# Patient Record
Sex: Male | Born: 1980 | Race: White | Hispanic: No | Marital: Single | Smoking: Never smoker
Health system: Southern US, Community
[De-identification: ages and names within clinical notes are randomized; demographics above are authoritative.]

---

## 2010-11-10 ENCOUNTER — Ambulatory Visit: Payer: Self-pay | Admitting: Otolaryngology

## 2010-12-01 ENCOUNTER — Ambulatory Visit: Payer: Self-pay | Admitting: Otolaryngology

## 2011-04-03 ENCOUNTER — Emergency Department: Payer: Self-pay | Admitting: Internal Medicine

## 2012-09-18 IMAGING — CT CT NECK WITH CONTRAST
2 series · 10 of 14 positions shown, 12 images · IV contrast (isovue)
Comparison: none

REASON FOR EXAM: left lateral neck mass
COMMENTS:

PROCEDURE:     ZIKHALE - ZIKHALE NECK WITH CONTRAST  - November 10, 2010 [DATE]
RESULT:     Comparison: None.
TECHNIQUE: Standard soft tissue neck CT protocol, after administration of 75
mL Isovue 370.

[Series 2: soft tissue · axial · 0.55mm/px · z∈[-295,-64]mm · 8 of 101 slices shown, 10 images]
[im 12/101  soft-tissue]
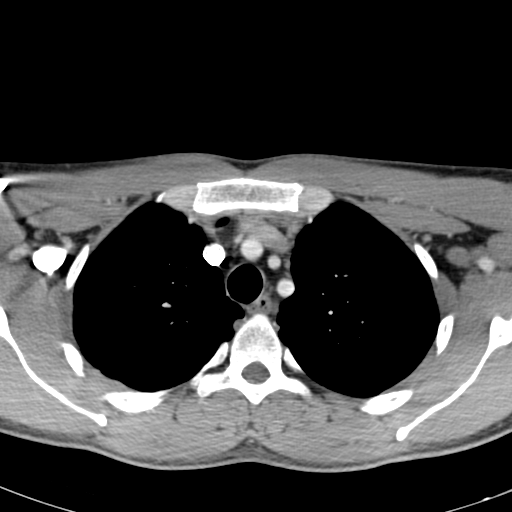
[im 12/101  bone]
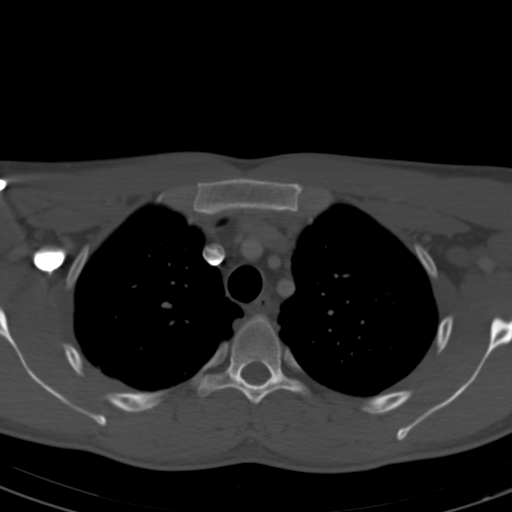
[im 23/101  bone]
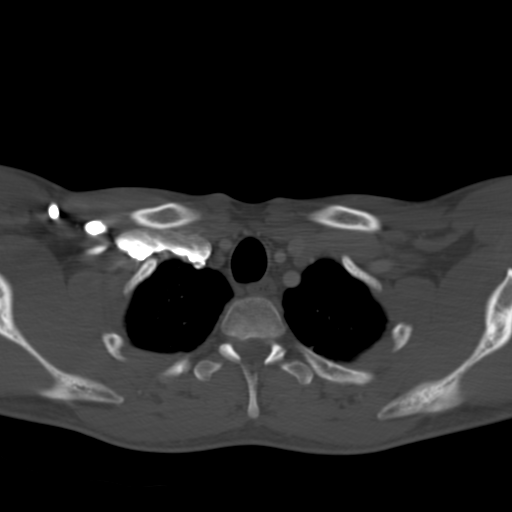
[im 34/101  bone]
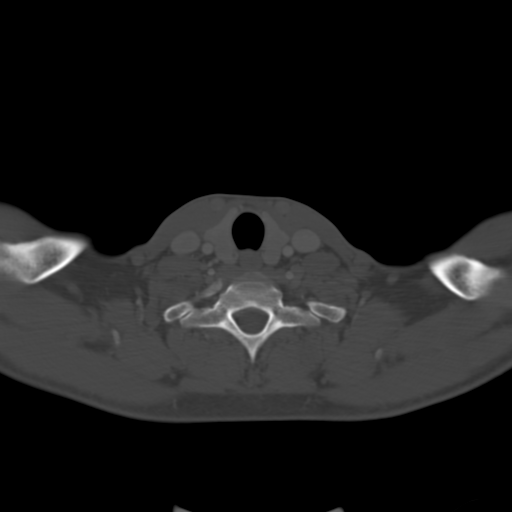
[im 45/101  bone]
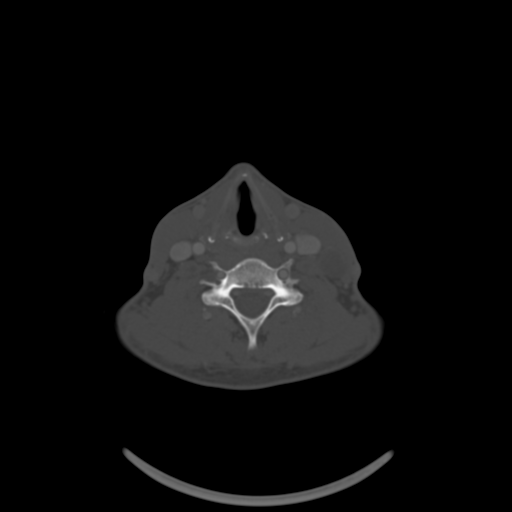
[im 56/101  soft-tissue]
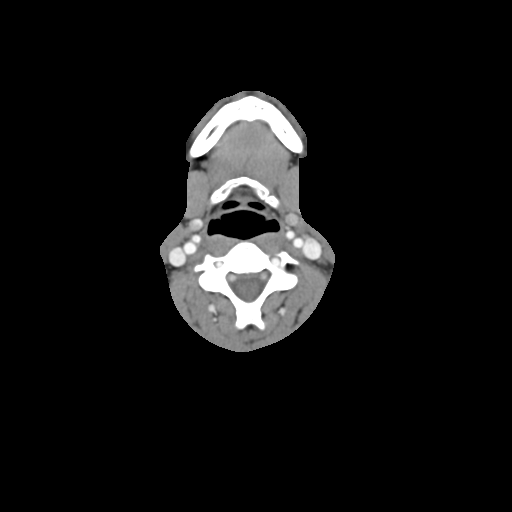
[im 56/101  bone]
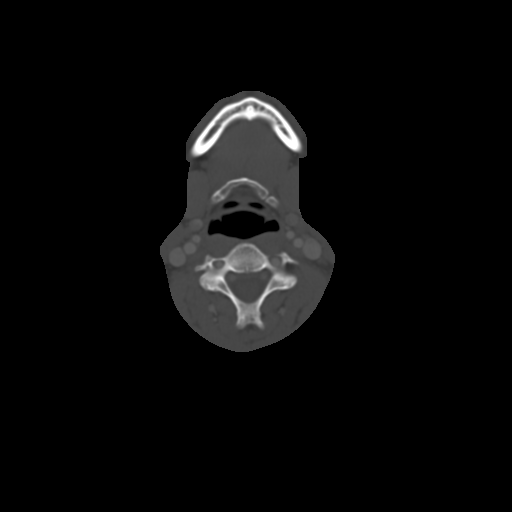
[im 67/101  bone]
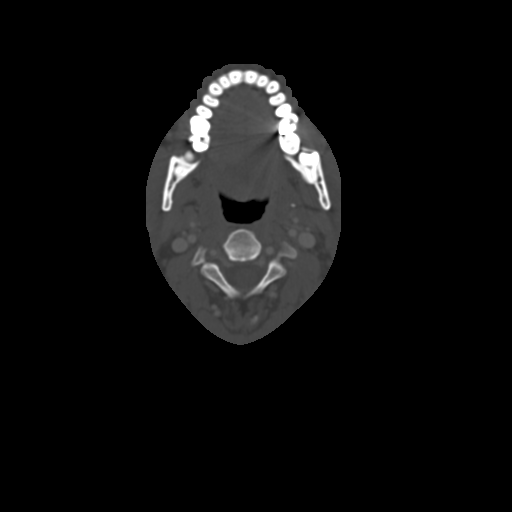
[im 78/101  bone]
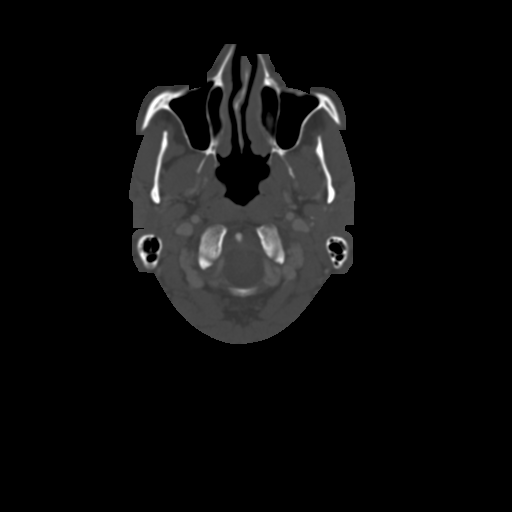
[im 89/101  bone]
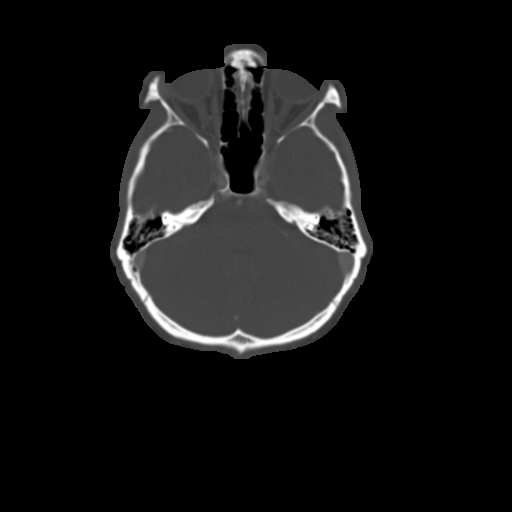

[Series 3: lung · axial · 0.55mm/px · z∈[-295,-262]mm · 2 of 34 slices shown]
[im 12/34  bone]
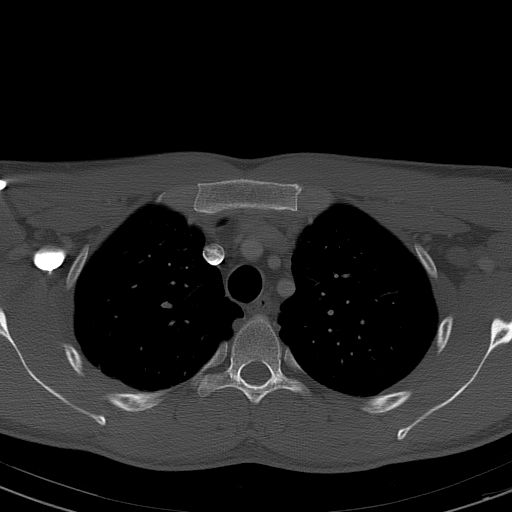
[im 23/34  bone]
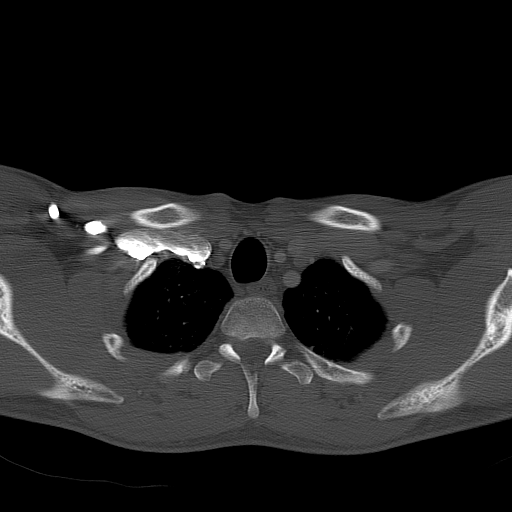

[10 of 14 positions shown; findings below may reference images not displayed]

FINDINGS: A palpable marker was placed along the left side of the neck at the reported
site of palpable abnormality. At the level of this BB, there is a round
low-attenuation mass. The density of which is greater than simple fluid. The
area of low density measures 1.7 x 1.6 cm in greatest axial dimension. There
appears to be a rim of enhancing tissue surrounding this lesion. This could
represent a rim of a lymph node. This mass is nonspecific. It is deep to the
sternocleidomastoid muscle and posterior lateral to the left jugular vein.
There are a few separate mildly prominent left carotid chain lymph nodes,
none of which are pathologically enlarged.

The airway is patent. A no aggressive lytic or sclerotic osseous lesions
identified.
IMPRESSION: Nonspecific cystic mass in the left side of the neck. There appears to be a
peripheral area of enhancing soft tissue. At a minimum, clinical followup is
recommended to ensure resolution, as the differential for this mass would
include a necrotic lymph node from squamous cell carcinoma. Tissue sampling
could be performed, as indicated.

## 2020-03-18 ENCOUNTER — Other Ambulatory Visit: Payer: Self-pay

## 2020-03-18 DIAGNOSIS — Z20822 Contact with and (suspected) exposure to covid-19: Secondary | ICD-10-CM

## 2020-03-20 LAB — NOVEL CORONAVIRUS, NAA: SARS-CoV-2, NAA: DETECTED — AB

## 2020-03-20 LAB — SARS-COV-2, NAA 2 DAY TAT

## 2020-11-30 ENCOUNTER — Emergency Department
Admission: EM | Admit: 2020-11-30 | Discharge: 2020-12-01 | Payer: Worker's Compensation | Attending: Emergency Medicine | Admitting: Emergency Medicine

## 2020-11-30 ENCOUNTER — Encounter: Payer: Self-pay | Admitting: *Deleted

## 2020-11-30 ENCOUNTER — Other Ambulatory Visit: Payer: Self-pay

## 2020-11-30 ENCOUNTER — Emergency Department: Payer: Worker's Compensation

## 2020-11-30 DIAGNOSIS — S82832A Other fracture of upper and lower end of left fibula, initial encounter for closed fracture: Secondary | ICD-10-CM | POA: Insufficient documentation

## 2020-11-30 DIAGNOSIS — S82302A Unspecified fracture of lower end of left tibia, initial encounter for closed fracture: Secondary | ICD-10-CM | POA: Insufficient documentation

## 2020-11-30 DIAGNOSIS — W19XXXA Unspecified fall, initial encounter: Secondary | ICD-10-CM

## 2020-11-30 DIAGNOSIS — Y990 Civilian activity done for income or pay: Secondary | ICD-10-CM | POA: Insufficient documentation

## 2020-11-30 DIAGNOSIS — Z20822 Contact with and (suspected) exposure to covid-19: Secondary | ICD-10-CM | POA: Insufficient documentation

## 2020-11-30 DIAGNOSIS — S82899A Other fracture of unspecified lower leg, initial encounter for closed fracture: Secondary | ICD-10-CM

## 2020-11-30 DIAGNOSIS — M79605 Pain in left leg: Secondary | ICD-10-CM | POA: Insufficient documentation

## 2020-11-30 DIAGNOSIS — W08XXXA Fall from other furniture, initial encounter: Secondary | ICD-10-CM | POA: Insufficient documentation

## 2020-11-30 LAB — CBC WITH DIFFERENTIAL/PLATELET
Abs Immature Granulocytes: 0.05 10*3/uL (ref 0.00–0.07)
Basophils Absolute: 0.1 10*3/uL (ref 0.0–0.1)
Basophils Relative: 1 %
Eosinophils Absolute: 0.1 10*3/uL (ref 0.0–0.5)
Eosinophils Relative: 1 %
HCT: 43.1 % (ref 39.0–52.0)
Hemoglobin: 15.2 g/dL (ref 13.0–17.0)
Immature Granulocytes: 0 %
Lymphocytes Relative: 13 %
Lymphs Abs: 1.7 10*3/uL (ref 0.7–4.0)
MCH: 32.1 pg (ref 26.0–34.0)
MCHC: 35.3 g/dL (ref 30.0–36.0)
MCV: 90.9 fL (ref 80.0–100.0)
Monocytes Absolute: 0.6 10*3/uL (ref 0.1–1.0)
Monocytes Relative: 5 %
Neutro Abs: 10.4 10*3/uL — ABNORMAL HIGH (ref 1.7–7.7)
Neutrophils Relative %: 80 %
Platelets: 234 10*3/uL (ref 150–400)
RBC: 4.74 MIL/uL (ref 4.22–5.81)
RDW: 12.8 % (ref 11.5–15.5)
WBC: 13 10*3/uL — ABNORMAL HIGH (ref 4.0–10.5)
nRBC: 0 % (ref 0.0–0.2)

## 2020-11-30 LAB — COMPREHENSIVE METABOLIC PANEL
ALT: 19 U/L (ref 0–44)
AST: 20 U/L (ref 15–41)
Albumin: 4.5 g/dL (ref 3.5–5.0)
Alkaline Phosphatase: 45 U/L (ref 38–126)
Anion gap: 9 (ref 5–15)
BUN: 11 mg/dL (ref 6–20)
CO2: 27 mmol/L (ref 22–32)
Calcium: 9.1 mg/dL (ref 8.9–10.3)
Chloride: 99 mmol/L (ref 98–111)
Creatinine, Ser: 0.77 mg/dL (ref 0.61–1.24)
GFR, Estimated: >60 mL/min (ref >60)
Glucose, Bld: 105 mg/dL — ABNORMAL HIGH (ref 70–99)
Potassium: 4.4 mmol/L (ref 3.5–5.1)
Sodium: 135 mmol/L (ref 135–145)
Total Bilirubin: 1 mg/dL (ref 0.3–1.2)
Total Protein: 7.8 g/dL (ref 6.5–8.1)

## 2020-11-30 LAB — RESP PANEL BY RT-PCR (FLU A&B, COVID) ARPGX2
Influenza A by PCR: NEGATIVE
Influenza B by PCR: NEGATIVE
SARS Coronavirus 2 by RT PCR: NEGATIVE

## 2020-11-30 MED ORDER — ONDANSETRON HCL 4 MG/2ML IJ SOLN
4.0000 mg | Freq: Once | INTRAMUSCULAR | Status: AC
  Administered 2020-11-30: 4 mg via INTRAVENOUS
  Filled 2020-11-30: qty 2

## 2020-11-30 MED ORDER — FENTANYL CITRATE (PF) 250 MCG/5ML IJ SOLN
100.0000 ug | Freq: Once | INTRAMUSCULAR | Status: AC
Start: 2020-11-30 — End: 2020-11-30
  Administered 2020-11-30: 100 ug via INTRAMUSCULAR
  Filled 2020-11-30: qty 5

## 2020-11-30 MED ORDER — FENTANYL CITRATE PF 50 MCG/ML IJ SOSY
50.0000 ug | PREFILLED_SYRINGE | Freq: Once | INTRAMUSCULAR | Status: AC
  Administered 2020-11-30: 50 ug via INTRAVENOUS
  Filled 2020-11-30: qty 1

## 2020-11-30 NOTE — ED Notes (Signed)
Shera to [employer] to [employer]

## 2020-11-30 NOTE — ED Provider Notes (Signed)
[facility]-EMERGENCY DEPARTMENT  ____________________________________________  Time seen: Approximately 9:09 PM  I have reviewed the triage vital signs and the nursing notes.   HISTORY  Chief Complaint Leg Injury   Historian Patient     HPI Kenneth Park. is a 40 y.o. male presents to the emergency department after patient had a mechanical fall from a 2 foot step stool.  Patient states that he inverted his ankle and lost his balance, falling backwards.  Patient denies hitting his head or his neck.  He denies chest pain, chest tightness or abdominal pain.  No numbness or tingling in the upper and lower extremities.  Patient has not been able to bear weight since injury occurred.  He has no skin compromise of the left lower extremity.   No past medical history on file.   Immunizations up to date:  Yes.     No past medical history on file.  There are no problems to display for this patient.     Prior to Admission medications   Not on File    Allergies Patient has no known allergies.  No family history on file.  Social History Social History   Tobacco Use   Smoking status: Never   Smokeless tobacco: Never  Substance Use Topics   Alcohol use: Yes     Review of Systems  Constitutional: No fever/chills Eyes:  No discharge ENT: No upper respiratory complaints. Respiratory: no cough. No SOB/ use of accessory muscles to breath Gastrointestinal:   No nausea, no vomiting.  No diarrhea.  No constipation. Musculoskeletal: Patient has left lower leg pain.  Skin: Negative for rash, abrasions, lacerations, ecchymosis.    ____________________________________________   PHYSICAL EXAM:  VITAL SIGNS: ED Triage Vitals  Enc Vitals Group     BP 11/30/20 2017 134/82     Pulse Rate 11/30/20 2017 67     Resp 11/30/20 2017 18     Temp 11/30/20 2017 97.8 F (36.6 C)     Temp Source 11/30/20 2017 Oral     SpO2 11/30/20 2017 97 %     Weight 11/30/20 2016 180 lb  (81.6 kg)     Height 11/30/20 2016 6' (1.829 m)     Head Circumference --      Peak Flow --      Pain Score 11/30/20 2016 8     Pain Loc --      Pain Edu? --      Excl. in GC? --      Constitutional: Alert and oriented. Well appearing and in no acute distress. Eyes: Conjunctivae are normal. PERRL. EOMI. Head: Atraumatic. ENT:      Nose: No congestion/rhinnorhea.      Mouth/Throat: Mucous membranes are moist.  Neck: No stridor.  No cervical spine tenderness to palpation. Cardiovascular: Normal rate, regular rhythm. Normal S1 and S2.  Good peripheral circulation. Respiratory: Normal respiratory effort without tachypnea or retractions. Lungs CTAB. Good air entry to the bases with no decreased or absent breath sounds Gastrointestinal: Bowel sounds x 4 quadrants. Soft and nontender to palpation. No guarding or rigidity. No distention. Musculoskeletal: Patient's left lower extremity splinted with a stirrup splint upon initial exam.  Patient can move all 5 left toes.  Palpable dorsalis pedis pulse, left.  Capillary refill less than 2 seconds on the left. Neurologic:  Normal for age. No gross focal neurologic deficits are appreciated.  Skin:  Skin is warm, dry and intact. No rash noted. Psychiatric: Mood and affect are normal  for age. Speech and behavior are normal.   ____________________________________________   LABS (all labs ordered are listed, but only abnormal results are displayed)  Labs Reviewed  CBC WITH DIFFERENTIAL/PLATELET - Abnormal; Notable for the following components:      Result Value   WBC 13.0 (*)    Neutro Abs 10.4 (*)    All other components within normal limits  COMPREHENSIVE METABOLIC PANEL - Abnormal; Notable for the following components:   Glucose, Bld 105 (*)    All other components within normal limits  RESP PANEL BY RT-PCR (FLU A&B, COVID) [id number]    ____________________________________________  EKG   ____________________________________________  RADIOLOGY Geraldo Pitter, personally viewed and evaluated these images (plain radiographs) as part of my medical decision making, as well as reviewing the written report by the radiologist.  DG Tibia/Fibula Left  Result Date: 11/30/2020 CLINICAL DATA:  Fall, swelling and deformity of the left ankle EXAM: LEFT ANKLE COMPLETE - 3+ VIEW; LEFT TIBIA AND FIBULA - 2 VIEW COMPARISON:  None. FINDINGS: Ankle: There is an acute comminuted fracture of the distal tibial diaphysis with a proximally 1.2 cm lateral displacement and 0.3 cm anterior displacement of the distal fragment. There is a comminuted fracture of the distal fibular metaphysis with approximately 0.3 cm lateral displacement and 0.5 cm posterior displacement of the distal fragment. There is extension into the syndesmotic joint. There is asymmetry of the ankle mortise suggesting ligamentous injury. There is diffuse soft tissue swelling. Tibia/fibula: There is no acute fracture of the proximal tibia or fibula. Knee alignment is maintained. IMPRESSION: Comminuted and displaced fractures of the distal tibia and fibula as above. Asymmetry of the ankle mortise raises suspicion for ligamentous injury. Electronically Signed   By: Lesia Hausen M.D.   On: 11/30/2020 20:50   DG Ankle Complete Left  Result Date: 11/30/2020 CLINICAL DATA:  Fall, swelling and deformity of the left ankle EXAM: LEFT ANKLE COMPLETE - 3+ VIEW; LEFT TIBIA AND FIBULA - 2 VIEW COMPARISON:  None. FINDINGS: Ankle: There is an acute comminuted fracture of the distal tibial diaphysis with a proximally 1.2 cm lateral displacement and 0.3 cm anterior displacement of the distal fragment. There is a comminuted fracture of the distal fibular metaphysis with approximately 0.3 cm lateral displacement and 0.5 cm posterior displacement of the distal fragment. There is extension into the  syndesmotic joint. There is asymmetry of the ankle mortise suggesting ligamentous injury. There is diffuse soft tissue swelling. Tibia/fibula: There is no acute fracture of the proximal tibia or fibula. Knee alignment is maintained. IMPRESSION: Comminuted and displaced fractures of the distal tibia and fibula as above. Asymmetry of the ankle mortise raises suspicion for ligamentous injury. Electronically Signed   By: Lesia Hausen M.D.   On: 11/30/2020 20:50    ____________________________________________    PROCEDURES  Procedure(s) performed:     Procedures     Medications  fentaNYL citrate (PF) (SUBLIMAZE) injection 100 mcg (100 mcg Intramuscular Given 11/30/20 2040)  ondansetron (ZOFRAN) injection 4 mg (4 mg Intravenous Given 11/30/20 2216)  fentaNYL (SUBLIMAZE) injection 50 mcg (50 mcg Intravenous Given 11/30/20 2216)     ____________________________________________   INITIAL IMPRESSION / ASSESSMENT AND PLAN / ED COURSE  Pertinent labs & imaging results that were available during my care of the patient were reviewed by me and considered in my medical decision making (see chart for details).      Assessment and plan Left leg pain 40 year old male presents to the emergency department with  acute left ankle pain after an inversion type ankle injury.  Vital signs are reassuring at triage.  On physical exam, patient had a deformity of the left ankle.  Skin overlying the left foot and the left ankle was warm to the touch and sensation was intact.  Patient had a palpable dorsalis pedis pulse on the left and was able to move all 5 left toes.  X-ray of the tibia/fibula and left ankle indicated comminuted distal tibia and fibula fractures with displacement and widening of the ankle mortise.  Patient's left ankle was splinted and fentanyl was given for pain.  I reached out to orthopedist on-call, Dr. Joice Lofts who recommended transfer given severity of fractures.  I discussed impending  transfer with patient and he requested [facility].   ----------------------------------------- 11:00 PM on 11/30/2020 ----------------------------------------- No beds available at [facility].  ----------------------------------------- 11:05 PM on 11/30/2020 ----------------------------------------- Consulted [facility] for transfer.   On-call orthopedist, Dr. Lacie Scotts accepted patient for transfer to  [facility] but stated that patient was on a lengthy waiting list and recommended consulting other tertiary centers for possible bed availability.  ----------------------------------------- 11:10 PM on 11/30/2020 ----------------------------------------- [facility] for transfer  Spoke with on-call orthopedist, Dr. Charlann Boxer.  Dr. Charlann Boxer stated [facility] had no Ortho bed availability and has a wait list for transfer.  Dr. Charlann Boxer stated that he would have Ortho trauma PA follow-up with patient's provider, Dr. Don Perking in the morning at 7 AM. Dr. Charlann Boxer recommended a follow-up phone call if there is no contact by Ortho trauma by 7:30 AM.  In addition to splinting, Dr. Charlann Boxer recommended knee immobilizer.  ----------------------------------------- 11:35 PM on 11/30/2020 -----------------------------------------  Patient care was transitioned to attending Dr. Nita Sickle at shift change.   ____________________________________________  FINAL CLINICAL IMPRESSION(S) / ED DIAGNOSES  Final diagnoses:  None      NEW MEDICATIONS STARTED DURING THIS VISIT:  ED Discharge Orders     None           This chart was dictated using voice recognition software/Dragon. Despite best efforts to proofread, errors can occur which can change the meaning. Any change was purely unintentional.     Orvil Feil, PA-C 11/30/20 2345    Chesley Noon, MD 12/01/20 [phone]

## 2020-11-30 NOTE — ED Triage Notes (Signed)
Pt fell off a step stool.  Pt has swelling and deformity of left ankle.  Denies other injury .   Pt alert.

## 2020-11-30 NOTE — ED Notes (Signed)
Pt to room 46 via wheelchair.  Lower leg deformity and unstable.  Temp stirrup splint applied to stabilize leg.

## 2020-12-01 ENCOUNTER — Inpatient Hospital Stay (HOSPITAL_COMMUNITY): Payer: Self-pay | Admitting: Anesthesiology

## 2020-12-01 ENCOUNTER — Encounter (HOSPITAL_COMMUNITY): Payer: Self-pay | Admitting: Orthopedic Surgery

## 2020-12-01 ENCOUNTER — Emergency Department: Payer: Worker's Compensation

## 2020-12-01 ENCOUNTER — Observation Stay (HOSPITAL_COMMUNITY): Payer: Self-pay

## 2020-12-01 ENCOUNTER — Observation Stay (HOSPITAL_COMMUNITY)
Admission: RE | Admit: 2020-12-01 | Discharge: 2020-12-02 | Disposition: A | Discharge status: Home / Self Care | Payer: Self-pay | Source: Ambulatory Visit | Attending: Orthopedic Surgery | Admitting: Orthopedic Surgery

## 2020-12-01 ENCOUNTER — Encounter (HOSPITAL_COMMUNITY)
Admission: RE | Disposition: A | Discharge status: Home / Self Care | Payer: Self-pay | Source: Ambulatory Visit | Attending: Orthopedic Surgery

## 2020-12-01 ENCOUNTER — Inpatient Hospital Stay (HOSPITAL_COMMUNITY): Payer: Self-pay

## 2020-12-01 DIAGNOSIS — S93432A Sprain of tibiofibular ligament of left ankle, initial encounter: Secondary | ICD-10-CM | POA: Insufficient documentation

## 2020-12-01 DIAGNOSIS — T148XXA Other injury of unspecified body region, initial encounter: Secondary | ICD-10-CM

## 2020-12-01 DIAGNOSIS — S82242A Displaced spiral fracture of shaft of left tibia, initial encounter for closed fracture: Secondary | ICD-10-CM | POA: Diagnosis present

## 2020-12-01 DIAGNOSIS — Z419 Encounter for procedure for purposes other than remedying health state, unspecified: Secondary | ICD-10-CM

## 2020-12-01 DIAGNOSIS — S82252A Displaced comminuted fracture of shaft of left tibia, initial encounter for closed fracture: Principal | ICD-10-CM | POA: Insufficient documentation

## 2020-12-01 DIAGNOSIS — S32482A Displaced dome fracture of left acetabulum, initial encounter for closed fracture: Secondary | ICD-10-CM | POA: Insufficient documentation

## 2020-12-01 DIAGNOSIS — W11XXXA Fall on and from ladder, initial encounter: Secondary | ICD-10-CM | POA: Insufficient documentation

## 2020-12-01 DIAGNOSIS — S82842A Displaced bimalleolar fracture of left lower leg, initial encounter for closed fracture: Secondary | ICD-10-CM | POA: Insufficient documentation

## 2020-12-01 DIAGNOSIS — S82892A Other fracture of left lower leg, initial encounter for closed fracture: Secondary | ICD-10-CM | POA: Diagnosis present

## 2020-12-01 HISTORY — PX: ORIF ANKLE FRACTURE: SHX5408

## 2020-12-01 HISTORY — DX: Displaced spiral fracture of shaft of left tibia, initial encounter for closed fracture: S82.242A

## 2020-12-01 HISTORY — PX: TIBIA IM NAIL INSERTION: SHX2516

## 2020-12-01 LAB — CREATININE, SERUM
Creatinine, Ser: 0.98 mg/dL (ref 0.61–1.24)
GFR, Estimated: >60 mL/min (ref >60)

## 2020-12-01 LAB — CBC
HCT: 41.1 % (ref 39.0–52.0)
Hemoglobin: 13.9 g/dL (ref 13.0–17.0)
MCH: 31.3 pg (ref 26.0–34.0)
MCHC: 33.8 g/dL (ref 30.0–36.0)
MCV: 92.6 fL (ref 80.0–100.0)
Platelets: 214 10*3/uL (ref 150–400)
RBC: 4.44 MIL/uL (ref 4.22–5.81)
RDW: 13 % (ref 11.5–15.5)
WBC: 9.4 10*3/uL (ref 4.0–10.5)
nRBC: 0 % (ref 0.0–0.2)

## 2020-12-01 SURGERY — OPEN REDUCTION INTERNAL FIXATION (ORIF) ANKLE FRACTURE
Anesthesia: General | Site: Leg Lower | Laterality: Left

## 2020-12-01 MED ORDER — FENTANYL CITRATE (PF) 250 MCG/5ML IJ SOLN
INTRAMUSCULAR | Status: AC
  Filled 2020-12-01: qty 5

## 2020-12-01 MED ORDER — ACETAMINOPHEN 500 MG PO TABS
1000.0000 mg | ORAL_TABLET | Freq: Four times a day (QID) | ORAL | Status: DC
  Administered 2020-12-01 – 2020-12-02 (×3): 1000 mg via ORAL
  Filled 2020-12-01 (×3): qty 2

## 2020-12-01 MED ORDER — FENTANYL CITRATE (PF) 250 MCG/5ML IJ SOLN
INTRAMUSCULAR | Status: DC | PRN
  Administered 2020-12-01: 25 ug via INTRAVENOUS
  Administered 2020-12-01: 75 ug via INTRAVENOUS

## 2020-12-01 MED ORDER — METOCLOPRAMIDE HCL 5 MG/ML IJ SOLN
5.0000 mg | Freq: Three times a day (TID) | INTRAMUSCULAR | Status: DC | PRN
Start: 2020-12-01 — End: 2020-12-02

## 2020-12-01 MED ORDER — CHLORHEXIDINE GLUCONATE 0.12 % MT SOLN
15.0000 mL | Freq: Once | OROMUCOSAL | Status: AC
  Filled 2020-12-01: qty 15

## 2020-12-01 MED ORDER — METHOCARBAMOL 500 MG PO TABS
1000.0000 mg | ORAL_TABLET | Freq: Three times a day (TID) | ORAL | Status: DC
  Administered 2020-12-01 – 2020-12-02 (×3): 1000 mg via ORAL
  Filled 2020-12-01 (×3): qty 2

## 2020-12-01 MED ORDER — ENOXAPARIN SODIUM 40 MG/0.4ML IJ SOSY
40.0000 mg | PREFILLED_SYRINGE | INTRAMUSCULAR | Status: DC
  Administered 2020-12-02: 40 mg via SUBCUTANEOUS
  Filled 2020-12-01: qty 0.4

## 2020-12-01 MED ORDER — POVIDONE-IODINE 10 % EX SWAB
2.0000 "application " | Freq: Once | CUTANEOUS | Status: AC
  Administered 2020-12-01: 2 via TOPICAL

## 2020-12-01 MED ORDER — FENTANYL CITRATE PF 50 MCG/ML IJ SOSY
50.0000 ug | PREFILLED_SYRINGE | INTRAMUSCULAR | Status: DC | PRN
  Administered 2020-12-01: 50 ug via INTRAVENOUS
  Filled 2020-12-01: qty 1

## 2020-12-01 MED ORDER — OXYCODONE HCL 5 MG PO TABS: 10.0000 mg | ORAL_TABLET | ORAL | Status: DC | PRN

## 2020-12-01 MED ORDER — KETOROLAC TROMETHAMINE 30 MG/ML IJ SOLN
INTRAMUSCULAR | Status: DC | PRN
  Administered 2020-12-01: 30 mg via INTRAVENOUS

## 2020-12-01 MED ORDER — EPHEDRINE SULFATE-NACL 50-0.9 MG/10ML-% IV SOSY
PREFILLED_SYRINGE | INTRAVENOUS | Status: DC | PRN
  Administered 2020-12-01: 5 mg via INTRAVENOUS

## 2020-12-01 MED ORDER — PROPOFOL 10 MG/ML IV BOLUS
INTRAVENOUS | Status: DC | PRN
  Administered 2020-12-01: 130 mg via INTRAVENOUS

## 2020-12-01 MED ORDER — ROCURONIUM BROMIDE 100 MG/10ML IV SOLN
INTRAVENOUS | Status: DC | PRN
  Administered 2020-12-01: 20 mg via INTRAVENOUS
  Administered 2020-12-01: 10 mg via INTRAVENOUS
  Administered 2020-12-01: 50 mg via INTRAVENOUS
  Administered 2020-12-01: 20 mg via INTRAVENOUS

## 2020-12-01 MED ORDER — DOCUSATE SODIUM 100 MG PO CAPS
100.0000 mg | ORAL_CAPSULE | Freq: Two times a day (BID) | ORAL | Status: DC
Start: 2020-12-01 — End: 2020-12-02
  Administered 2020-12-01 – 2020-12-02 (×2): 100 mg via ORAL
  Filled 2020-12-01 (×2): qty 1

## 2020-12-01 MED ORDER — HYDROMORPHONE HCL 1 MG/ML IJ SOLN
INTRAMUSCULAR | Status: AC
  Filled 2020-12-01: qty 0.5

## 2020-12-01 MED ORDER — VITAMIN D 25 MCG (1000 UNIT) PO TABS
2000.0000 [IU] | ORAL_TABLET | Freq: Two times a day (BID) | ORAL | Status: DC
  Administered 2020-12-01 – 2020-12-02 (×2): 2000 [IU] via ORAL
  Filled 2020-12-01 (×2): qty 2

## 2020-12-01 MED ORDER — HYDROMORPHONE HCL 1 MG/ML IJ SOLN
1.0000 mg | INTRAMUSCULAR | Status: AC | PRN
  Administered 2020-12-01 (×3): 1 mg via INTRAVENOUS
  Filled 2020-12-01 (×3): qty 1

## 2020-12-01 MED ORDER — KETOROLAC TROMETHAMINE 15 MG/ML IJ SOLN
15.0000 mg | Freq: Four times a day (QID) | INTRAMUSCULAR | Status: DC
  Administered 2020-12-01 – 2020-12-02 (×3): 15 mg via INTRAVENOUS
  Filled 2020-12-01 (×3): qty 1

## 2020-12-01 MED ORDER — MIDAZOLAM HCL 5 MG/5ML IJ SOLN
INTRAMUSCULAR | Status: DC | PRN
  Administered 2020-12-01: 2 mg via INTRAVENOUS

## 2020-12-01 MED ORDER — HYDROMORPHONE HCL 1 MG/ML IJ SOLN: 0.5000 mg | INTRAMUSCULAR | Status: DC | PRN

## 2020-12-01 MED ORDER — CHLORHEXIDINE GLUCONATE 4 % EX LIQD: 60.0000 mL | Freq: Once | CUTANEOUS | Status: DC

## 2020-12-01 MED ORDER — OXYCODONE HCL 5 MG PO TABS: 5.0000 mg | ORAL_TABLET | Freq: Once | ORAL | Status: DC | PRN

## 2020-12-01 MED ORDER — HYDROMORPHONE HCL 1 MG/ML IJ SOLN
1.0000 mg | Freq: Once | INTRAMUSCULAR | Status: AC
Start: 2020-12-01 — End: 2020-12-01
  Administered 2020-12-01: 1 mg via INTRAVENOUS
  Filled 2020-12-01: qty 1

## 2020-12-01 MED ORDER — SUGAMMADEX SODIUM 200 MG/2ML IV SOLN
INTRAVENOUS | Status: DC | PRN
  Administered 2020-12-01: 200 mg via INTRAVENOUS

## 2020-12-01 MED ORDER — CHLORHEXIDINE GLUCONATE 0.12 % MT SOLN
OROMUCOSAL | Status: AC
  Administered 2020-12-01: 15 mL via OROMUCOSAL
  Filled 2020-12-01: qty 15

## 2020-12-01 MED ORDER — DEXAMETHASONE SODIUM PHOSPHATE 4 MG/ML IJ SOLN
INTRAMUSCULAR | Status: DC | PRN
  Administered 2020-12-01: 10 mg via INTRAVENOUS

## 2020-12-01 MED ORDER — POLYETHYLENE GLYCOL 3350 17 G PO PACK: 17.0000 g | PACK | Freq: Every day | ORAL | Status: DC

## 2020-12-01 MED ORDER — OXYCODONE HCL 5 MG/5ML PO SOLN: 5.0000 mg | Freq: Once | ORAL | Status: DC | PRN

## 2020-12-01 MED ORDER — CEFAZOLIN SODIUM-DEXTROSE 1-4 GM/50ML-% IV SOLN
1.0000 g | Freq: Four times a day (QID) | INTRAVENOUS | Status: AC
  Administered 2020-12-01 – 2020-12-02 (×3): 1 g via INTRAVENOUS
  Filled 2020-12-01 (×3): qty 50

## 2020-12-01 MED ORDER — SODIUM CHLORIDE 0.9 % IV SOLN: Freq: Once | INTRAVENOUS | Status: AC

## 2020-12-01 MED ORDER — FENTANYL CITRATE (PF) 100 MCG/2ML IJ SOLN
INTRAMUSCULAR | Status: AC
  Filled 2020-12-01: qty 2

## 2020-12-01 MED ORDER — PHENYLEPHRINE 40 MCG/ML (10ML) SYRINGE FOR IV PUSH (FOR BLOOD PRESSURE SUPPORT)
PREFILLED_SYRINGE | INTRAVENOUS | Status: DC | PRN
  Administered 2020-12-01 (×3): 80 ug via INTRAVENOUS
  Administered 2020-12-01: 40 ug via INTRAVENOUS

## 2020-12-01 MED ORDER — METHOCARBAMOL 1000 MG/10ML IJ SOLN
1000.0000 mg | Freq: Three times a day (TID) | INTRAVENOUS | Status: DC
  Filled 2020-12-01: qty 10

## 2020-12-01 MED ORDER — CEFAZOLIN SODIUM-DEXTROSE 2-4 GM/100ML-% IV SOLN
INTRAVENOUS | Status: AC
  Filled 2020-12-01: qty 100

## 2020-12-01 MED ORDER — LACTATED RINGERS IV SOLN: INTRAVENOUS | Status: DC

## 2020-12-01 MED ORDER — MORPHINE SULFATE (PF) 4 MG/ML IV SOLN
4.0000 mg | Freq: Once | INTRAVENOUS | Status: AC
Start: 2020-12-01 — End: 2020-12-01
  Administered 2020-12-01: 4 mg via INTRAVENOUS
  Filled 2020-12-01: qty 1

## 2020-12-01 MED ORDER — ONDANSETRON HCL 4 MG/2ML IJ SOLN: 4.0000 mg | Freq: Four times a day (QID) | INTRAMUSCULAR | Status: DC | PRN

## 2020-12-01 MED ORDER — 0.9 % SODIUM CHLORIDE (POUR BTL) OPTIME
TOPICAL | Status: DC | PRN
  Administered 2020-12-01: 1000 mL

## 2020-12-01 MED ORDER — HYDROMORPHONE HCL 1 MG/ML IJ SOLN
INTRAMUSCULAR | Status: DC | PRN
  Administered 2020-12-01 (×2): .5 mg via INTRAVENOUS

## 2020-12-01 MED ORDER — ORAL CARE MOUTH RINSE: 15.0000 mL | Freq: Once | OROMUCOSAL | Status: AC

## 2020-12-01 MED ORDER — ACETAMINOPHEN 325 MG PO TABS: 325.0000 mg | ORAL_TABLET | Freq: Four times a day (QID) | ORAL | Status: DC | PRN

## 2020-12-01 MED ORDER — MIDAZOLAM HCL 2 MG/2ML IJ SOLN
INTRAMUSCULAR | Status: AC
  Filled 2020-12-01: qty 2

## 2020-12-01 MED ORDER — PROPOFOL 10 MG/ML IV BOLUS
INTRAVENOUS | Status: AC
  Filled 2020-12-01: qty 20

## 2020-12-01 MED ORDER — HYDROMORPHONE HCL 1 MG/ML IJ SOLN
INTRAMUSCULAR | Status: AC
  Filled 2020-12-01: qty 1

## 2020-12-01 MED ORDER — CEFAZOLIN SODIUM-DEXTROSE 2-4 GM/100ML-% IV SOLN
2.0000 g | INTRAVENOUS | Status: AC
  Administered 2020-12-01: 2 g via INTRAVENOUS
  Filled 2020-12-01: qty 100

## 2020-12-01 MED ORDER — ONDANSETRON HCL 4 MG/2ML IJ SOLN: 4.0000 mg | Freq: Once | INTRAMUSCULAR | Status: DC | PRN

## 2020-12-01 MED ORDER — HYDROMORPHONE HCL 1 MG/ML IJ SOLN
0.2500 mg | INTRAMUSCULAR | Status: DC | PRN
  Administered 2020-12-01 (×4): 0.5 mg via INTRAVENOUS

## 2020-12-01 MED ORDER — LIDOCAINE HCL (CARDIAC) PF 100 MG/5ML IV SOSY
PREFILLED_SYRINGE | INTRAVENOUS | Status: DC | PRN
  Administered 2020-12-01: 60 mg via INTRAVENOUS

## 2020-12-01 MED ORDER — ONDANSETRON HCL 4 MG PO TABS: 4.0000 mg | ORAL_TABLET | Freq: Four times a day (QID) | ORAL | Status: DC | PRN

## 2020-12-01 MED ORDER — ASCORBIC ACID 500 MG PO TABS
1000.0000 mg | ORAL_TABLET | Freq: Every day | ORAL | Status: DC
  Administered 2020-12-01 – 2020-12-02 (×2): 1000 mg via ORAL
  Filled 2020-12-01 (×2): qty 2

## 2020-12-01 MED ORDER — OXYCODONE HCL 5 MG PO TABS
5.0000 mg | ORAL_TABLET | ORAL | Status: DC | PRN
  Administered 2020-12-01 – 2020-12-02 (×5): 10 mg via ORAL
  Filled 2020-12-01 (×5): qty 2

## 2020-12-01 MED ORDER — HYDROMORPHONE HCL 1 MG/ML IJ SOLN
INTRAMUSCULAR | Status: AC
  Administered 2020-12-01: 1 mg via INTRAVENOUS
  Filled 2020-12-01: qty 1

## 2020-12-01 MED ORDER — METOCLOPRAMIDE HCL 5 MG PO TABS: 5.0000 mg | ORAL_TABLET | Freq: Three times a day (TID) | ORAL | Status: DC | PRN

## 2020-12-01 MED ORDER — HYDROMORPHONE HCL 1 MG/ML IJ SOLN
1.0000 mg | Freq: Once | INTRAMUSCULAR | Status: AC
Start: 2020-12-01 — End: 2020-12-01

## 2020-12-01 MED ORDER — ONDANSETRON HCL 4 MG/2ML IJ SOLN
INTRAMUSCULAR | Status: DC | PRN
  Administered 2020-12-01: 4 mg via INTRAVENOUS

## 2020-12-01 MED ORDER — POTASSIUM CHLORIDE IN NACL 20-0.9 MEQ/L-% IV SOLN: INTRAVENOUS | Status: DC

## 2020-12-01 SURGICAL SUPPLY — 89 items
BAG COUNTER SPONGE SURGICOUNT (BAG) ×3 IMPLANT
BANDAGE ESMARK 6X9 LF (GAUZE/BANDAGES/DRESSINGS) IMPLANT
BIT DRILL 2.0X130 SLD AO (BIT) ×3 IMPLANT
BIT DRILL 2.5X2.75 QC CALB (BIT) ×3 IMPLANT
BIT DRILL 3.8X6 NS (BIT) ×3 IMPLANT
BIT DRILL 4.4 NS (BIT) ×3 IMPLANT
BIT DRILL CANN LONG 4.6X220 (DRILL) ×4 IMPLANT
BLADE SURG 10 STRL SS (BLADE) ×3 IMPLANT
BNDG ELASTIC 4X5.8 VLCR STR LF (GAUZE/BANDAGES/DRESSINGS) ×3 IMPLANT
BNDG ELASTIC 6X5.8 VLCR STR LF (GAUZE/BANDAGES/DRESSINGS) ×6 IMPLANT
BNDG ESMARK 6X9 LF (GAUZE/BANDAGES/DRESSINGS)
BNDG GAUZE ELAST 4 BULKY (GAUZE/BANDAGES/DRESSINGS) IMPLANT
BRUSH SCRUB EZ PLAIN DRY (MISCELLANEOUS) ×3 IMPLANT
COVER MAYO STAND STRL (DRAPES) ×3 IMPLANT
COVER SURGICAL LIGHT HANDLE (MISCELLANEOUS) ×6 IMPLANT
CUFF TOURN SGL QUICK 34 (TOURNIQUET CUFF) ×1
CUFF TRNQT CYL 34X4.125X (TOURNIQUET CUFF) ×2 IMPLANT
DRAPE C-ARM 42X72 X-RAY (DRAPES) ×3 IMPLANT
DRAPE C-ARMOR (DRAPES) ×3 IMPLANT
DRAPE HALF SHEET 40X57 (DRAPES) ×3 IMPLANT
DRAPE INCISE IOBAN 66X45 STRL (DRAPES) IMPLANT
DRAPE U-SHAPE 47X51 STRL (DRAPES) ×3 IMPLANT
DRILL CANN LONG 4.6X220 (DRILL) ×6
DRSG ADAPTIC 3X8 NADH LF (GAUZE/BANDAGES/DRESSINGS) IMPLANT
DRSG EMULSION OIL 3X3 NADH (GAUZE/BANDAGES/DRESSINGS) IMPLANT
DRSG MEPITEL 4X7.2 (GAUZE/BANDAGES/DRESSINGS) IMPLANT
DRSG MEPITEL 8X12 (GAUZE/BANDAGES/DRESSINGS) ×3 IMPLANT
DRSG PAD ABDOMINAL 8X10 ST (GAUZE/BANDAGES/DRESSINGS) ×3 IMPLANT
ELECT REM PT RETURN 9FT ADLT (ELECTROSURGICAL) ×3
ELECTRODE REM PT RTRN 9FT ADLT (ELECTROSURGICAL) ×2 IMPLANT
GAUZE SPONGE 4X4 12PLY STRL (GAUZE/BANDAGES/DRESSINGS) ×6 IMPLANT
GLOVE SRG 8 PF TXTR STRL LF DI (GLOVE) ×2 IMPLANT
GLOVE SURG ENC MOIS LTX SZ7.5 (GLOVE) ×3 IMPLANT
GLOVE SURG ENC MOIS LTX SZ8 (GLOVE) ×3 IMPLANT
GLOVE SURG ENC MOIS LTX SZ8.5 (GLOVE) ×3 IMPLANT
GLOVE SURG UNDER POLY LF SZ7.5 (GLOVE) ×3 IMPLANT
GLOVE SURG UNDER POLY LF SZ8 (GLOVE) ×1
GOWN STRL REUS W/ TWL LRG LVL3 (GOWN DISPOSABLE) ×4 IMPLANT
GOWN STRL REUS W/ TWL XL LVL3 (GOWN DISPOSABLE) ×2 IMPLANT
GOWN STRL REUS W/TWL LRG LVL3 (GOWN DISPOSABLE) ×2
GOWN STRL REUS W/TWL XL LVL3 (GOWN DISPOSABLE) ×1
GUIDEWIRE BALL NOSE 80CM (WIRE) ×3 IMPLANT
KIT BASIN OR (CUSTOM PROCEDURE TRAY) ×3 IMPLANT
KIT TURNOVER KIT B (KITS) ×3 IMPLANT
MANIFOLD NEPTUNE II (INSTRUMENTS) ×3 IMPLANT
NAIL TIBIAL 9MMX36CM (Nail) ×3 IMPLANT
NEEDLE HYPO 21X1.5 SAFETY (NEEDLE) IMPLANT
NS IRRIG 1000ML POUR BTL (IV SOLUTION) ×3 IMPLANT
PACK GENERAL/GYN (CUSTOM PROCEDURE TRAY) ×3 IMPLANT
PACK ORTHO EXTREMITY (CUSTOM PROCEDURE TRAY) ×3 IMPLANT
PAD ARMBOARD 7.5X6 YLW CONV (MISCELLANEOUS) ×6 IMPLANT
PAD CAST 4YDX4 CTTN HI CHSV (CAST SUPPLIES) ×4 IMPLANT
PADDING CAST COTTON 4X4 STRL (CAST SUPPLIES) ×2
PADDING CAST COTTON 6X4 STRL (CAST SUPPLIES) ×3 IMPLANT
PADDING CAST SYN 6 (CAST SUPPLIES) ×1
PADDING CAST SYNTHETIC 6X4 NS (CAST SUPPLIES) ×2 IMPLANT
PLATE FIBULAR CL 11H LT (Plate) ×3 IMPLANT
SCREW ACECAP 38MM (Screw) ×3 IMPLANT
SCREW ACECAP 46MM (Screw) ×3 IMPLANT
SCREW CORTICAL 3.5MM  46MM (Screw) ×1 IMPLANT
SCREW CORTICAL 3.5MM 46MM (Screw) ×2 IMPLANT
SCREW LOCK PLATE R3 2.7X13 (Screw) ×3 IMPLANT
SCREW LOCK PLATE R3 2.7X18 (Screw) ×6 IMPLANT
SCREW LOCK PLATE R3 2.7X20 (Screw) ×3 IMPLANT
SCREW NLOCK PLATE RECON 3.5X10 (Screw) ×6 IMPLANT
SCREW NLOCK PLATE RECON 3.5X48 (Screw) ×3 IMPLANT
SCREW NLOCK RECON 3.5X54 (Screw) ×3 IMPLANT
SCREW NON LOCKING 2.7X20 (Screw) ×3 IMPLANT
SCREW PROXIMAL 4.5MMX18MM (Screw) ×3 IMPLANT
SCREW PROXIMAL DEPUY (Screw) ×2 IMPLANT
SCREW PRXML FT 40X5.5XNS LF (Screw) ×2 IMPLANT
SCREW PRXML FT 65X5.5XNS CORT (Screw) ×2 IMPLANT
SPONGE T-LAP 18X18 ~~LOC~~+RFID (SPONGE) ×3 IMPLANT
STAPLER VISISTAT 35W (STAPLE) ×3 IMPLANT
SUCTION FRAZIER HANDLE 10FR (MISCELLANEOUS) ×1
SUCTION TUBE FRAZIER 10FR DISP (MISCELLANEOUS) ×2 IMPLANT
SUT ETHILON 2 0 FS 18 (SUTURE) ×6 IMPLANT
SUT PDS AB 2-0 CT1 27 (SUTURE) ×6 IMPLANT
SUT VIC AB 0 CT1 27 (SUTURE) ×1
SUT VIC AB 0 CT1 27XBRD ANBCTR (SUTURE) ×2 IMPLANT
SUT VIC AB 2-0 CT1 27 (SUTURE) ×3
SUT VIC AB 2-0 CT1 TAPERPNT 27 (SUTURE) ×6 IMPLANT
TOWEL GREEN STERILE (TOWEL DISPOSABLE) ×6 IMPLANT
TOWEL GREEN STERILE FF (TOWEL DISPOSABLE) ×3 IMPLANT
TUBE CONNECTING 12X1/4 (SUCTIONS) ×3 IMPLANT
UNDERPAD 30X36 HEAVY ABSORB (UNDERPADS AND DIAPERS) ×3 IMPLANT
WATER STERILE IRR 1000ML POUR (IV SOLUTION) ×3 IMPLANT
WIRE THREADED OLIVE 1.4 (WIRE) ×3 IMPLANT
YANKAUER SUCT BULB TIP NO VENT (SUCTIONS) IMPLANT

## 2020-12-01 NOTE — Transfer of Care (Signed)
Immediate Anesthesia Transfer of Care Note  Patient: Kenneth Park.  Procedure(s) Performed: OPEN REDUCTION INTERNAL FIXATION (ORIF) ANKLE FRACTURE (Left: Ankle) INTRAMEDULLARY (IM) NAIL TIBIAL (Left: Leg Lower)  Patient Location: PACU  Anesthesia Type:General  Level of Consciousness: awake, alert  and oriented  Airway & Oxygen Therapy: Patient Spontanous Breathing and Patient connected to nasal cannula oxygen  Post-op Assessment: Report given to RN and Post -op Vital signs reviewed and stable  Post vital signs: Reviewed and stable  Last Vitals:  Vitals Value Taken Time  BP 120/72 12/01/20 1447  Temp    Pulse 72 12/01/20 1448  Resp 9 12/01/20 1448  SpO2 97 % 12/01/20 1448  Vitals shown include unvalidated device data.  Last Pain:  Vitals:   12/01/20 1008  TempSrc: Oral         Complications: No notable events documented.

## 2020-12-01 NOTE — Op Note (Signed)
12/01/2020  2:38 PM  PATIENT:  Kenneth Park.  20-Aug-1980 male   MEDICAL RECORD NUMBER: [id number]  PRE-OPERATIVE DIAGNOSIS:   COMMINUTED LEFT TIBIAL SHAFT FRACTURE LEFT BIMALLEOLAR ANKLE FRACTURE POSSIBLE SYNDESMOTIC DISRUPTION  POST-OPERATIVE DIAGNOSIS:   COMMINUTED LEFT TIBIAL SHAFT FRACTURE LEFT BIMALLEOLAR ANKLE FRACTURE SYNDESMOTIC DISRUPTION  PROCEDURE:   INTRAMEDULLARY NAILING WITH BIOMET VERSANAIL 9 X 360 MM STATICALLY LOCKED OPEN REDUCTION INTERNAL FIXATION OF LEFT BIMALLEOLAR ANKLE FRACTURE OPEN REDUCTION INTERNAL FIXATION OF THE SYNDESMOSIS MANUAL APPLICATION OF STRESS ANKLE SYNDESMOSIS UNDER FLUOROSCOPY  SURGEON:  Doralee Albino. Carola Frost, M.D.  ASSISTANT:  Montez Morita, PA-C.  ANESTHESIA:  General.  COMPLICATIONS:  None.  TOURNIQUET: None.  ESTIMATED BLOOD LOSS:  50 mL.  DISPOSITION:  To PACU.  CONDITION:  Stable.  DELAY START OF DVT PROPHYLAXIS BECAUSE OF BLEEDING RISK: NO  BRIEF SUMMARY AND INDICATION FOR PROCEDURE:   Kenneth Park. is a 40 y.o.-year-old who sustained severe right leg injuries in a fall from a ladder while at work. Because of the complexity of combined injuries he was transferred to me from an outside hospital for treatment by a fellowship trained orthopaedic traumatologist. The patient required splinting, elevation, and a period of soft tissue swelling resolution before safely undergoing surgical repair.  We did discuss the risks and benefits of surgical reconstruction including the possibility of infection, nerve injury, vessel injury, malunion, nonunion, DVT, PE, heart attack,stroke, loss of motion, symptomatic hardware, need for further surgery, and others.   The patient acknowledged these risks and wished to proceed.   BRIEF SUMMARY OF PROCEDURE:  After administration of preoperative antibiotic, the patient was taken to the operating room.  Following a chlorhexidine wash, the left lower extremity was prepped with betadine scrub  and paint and then draped in usual sterile fashion.  No tourniquet was used during the procedure.  I began with the posterior malleolar component of the ankle fracture, brought in the C-arm, and then employed lag screw fixation perpendicular to the fracture line at the joint through stab incisions to reduce and compress the articular injury with an anterior-to-posterior screw, over drilling the near cortex with a 3.5 drill and then securing fixation in the far cortex with a standard 3.5 mm screw which was checked for length. Once reconstruction of the plafond was complete, the bone foam was removed, radiolucent triangle brought in.  The knee was bent up to 90 degrees and a 2.5 cm incision was made extending proximally from the distal pole of the patella.  In the deep tissues a medial parapatellar retinacular incision was made and then the curved cannulated awl inserted anterior to the edge of the articular surface and just medial to the lateral tibial spine. It was advance into the center of the proximal tibia.  A ball-tipped guidewire was bent and then advanced into the proximal tibia and then across the fracture site, watching on orthogonal views until it was centered in the distal plafond. While I maintained fracture reduction, my assistant reamed the tibia sequentially.  We encountered chatter at 9 mm, reamed to 10 mm, and placed 9 x 360 mm, statically locked nail. We did use the proximal locks off the jig and perfect circle technique for the distal locking bolts.   Because of the patient's bimalleolar ankle fracture, the question of lateral subluxation of the talus on the injury film, and the comminuted fibula which demonstrated some fracture fragments adjacent to the syndesmosis, a stress evaluation was performed, consisting of external rotation after all  fixation had been placed in the tibia. Under live fluoro, I identified motion at the syndesmosis with widening of the medial clear and lateral talar  subluxation.   I then used a lateral incision to bridge plate the comminuted fibula securing two bicortical screws proximally and multiple standard then later locked screws distally. I attempted to keep the plate somewhat posterior so that I might have 15 degrees of anteversion on the syndesmotic fixation. C-arm showed excellent reduction and appropriate placement of hardware. After reducing the syndesmosis and holding it stable, I attempted to place an anteriorly directed screw in front of the nail but the hardware could not accommodate this and so I accepted straight side to side trajectory for two quadricoritcal syndesmotic screws. Subsequent application of stress under fluoro now showed no widening or dynamic motion of the syndesmotic interval.   Montez Morita, PA-C, assisted me throughout, and his assistance was necessary as I held the reduction while he reamed and placed the Nail, and then he maintained reduction and held while I applied all ankle fixation and the stress view under fluoro.  Wounds irrigated thoroughly, closed in standard layered fashion using 0 Vicryl, 2-0 Vicryl, 3-0 nylon.  Sterile gently compressive dressing and splint were applied.  The patient was then taken to the PACU in stable condition.   PROGNOSIS:  Patient will be nonweightbearing on the right lower extremity with unrestricted range of motion of the knee.  Will anticipate removing the sutures, re-evaluate in 10 to 14 days, at which time, he will likely be transitioned from splint into a CAM boot to allow for unrestricted range of motion of the ankle as well.  Weightbearing will be anticipated to resume at 8 weeks given the syndesmotic disruption.  He will be on a formal pharmacologic DVT prophylaxis while in the hospital.  Myrene Galas, MD [facility], [facility] [phone]

## 2020-12-01 NOTE — Plan of Care (Signed)
Report called to short stay s/w Artelia Laroche, RN - [facility] here to transport, CT of leg completed prior to leaving and 4mg  Morphine given  to assist

## 2020-12-01 NOTE — ED Notes (Signed)
Called Kenneth Park [facility] as indicated by notes to check in am.  Kenneth Park  is reaching out for new consult transfer.  [id number]  No beds [facility] or [facility].

## 2020-12-01 NOTE — Anesthesia Procedure Notes (Signed)
Procedure Name: LMA Insertion Date/Time: 12/01/2020 11:26 AM Performed by: Macie Burows, CRNA Pre-anesthesia Checklist: Patient identified, Emergency Drugs available, Suction available, Patient being monitored and Timeout performed Patient Re-evaluated:Patient Re-evaluated prior to induction Oxygen Delivery Method: Circle system utilized Preoxygenation: Pre-oxygenation with 100% oxygen Induction Type: IV induction Ventilation: Mask ventilation without difficulty LMA Size: 5.0 Placement Confirmation: positive ETCO2 and breath sounds checked- equal and bilateral Tube secured with: Tape Dental Injury: Teeth and Oropharynx as per pre-operative assessment

## 2020-12-01 NOTE — ED Provider Notes (Signed)
-----------------------------------------   8:21 AM on 12/01/2020 ----------------------------------------- I spoke with PA on-call at [facility], who states that they will be able to schedule patient for surgery today so that he may be transferred to a short stay bed.  Patient to be accepted to orthopedic service under Dr. Carola Frost given he is young and healthy.   Chesley Noon, MD 12/01/20 [phone]

## 2020-12-01 NOTE — Anesthesia Preprocedure Evaluation (Addendum)
Anesthesia Evaluation  Patient identified by MRN, date of birth, ID band Patient awake    Reviewed: Allergy & Precautions, NPO status , Patient's Chart, lab work & pertinent test results, reviewed documented beta blocker date and time   Airway Mallampati: I  TM Distance: >3 FB Neck ROM: Full    Dental no notable dental hx. (+) Teeth Intact, Dental Advisory Given   Pulmonary neg pulmonary ROS,    Pulmonary exam normal breath sounds clear to auscultation       Cardiovascular negative cardio ROS Normal cardiovascular exam Rhythm:Regular Rate:Normal     Neuro/Psych negative neurological ROS  negative psych ROS   GI/Hepatic negative GI ROS, Neg liver ROS,   Endo/Other  negative endocrine ROS  Renal/GU   negative genitourinary   Musculoskeletal Comminuted Fx of Distal Tibia and Fibula   Abdominal   Peds  Hematology negative hematology ROS (+)   Anesthesia Other Findings   Reproductive/Obstetrics                            Anesthesia Physical Anesthesia Plan  ASA: 1  Anesthesia Plan: General   Post-op Pain Management:    Induction: Intravenous  PONV Risk Score and Plan: 3 and Treatment may vary due to age or medical condition, Midazolam, Ondansetron and Dexamethasone  Airway Management Planned: LMA  Additional Equipment:   Intra-op Plan:   Post-operative Plan: Extubation in OR  Informed Consent: I have reviewed the patients History and Physical, chart, labs and discussed the procedure including the risks, benefits and alternatives for the proposed anesthesia with the patient or authorized representative who has indicated his/her understanding and acceptance.     Dental advisory given  Plan Discussed with: CRNA and Anesthesiologist  Anesthesia Plan Comments:        Anesthesia Quick Evaluation

## 2020-12-01 NOTE — H&P (Signed)
Orthopaedic Trauma Service H&P  Patient ID: Kenneth Park. MRN: [id number] DOB/AGE: 1981/01/12 40 y.o.  Chief Complaint: left tibia and ankle HPI: Kenneth Park. is an 40 y.o. male.at work in [employer] capacity who fell off a ladder with immediate pain and swelling and deformity. Given the combined bimalleolar fracture and comminuted tibial shaft complexity of the acetabular fracture, the orthopaedic surgeon on call at [facility] asserted this was outside his scope of practice and that it would be in the best interest of the patient to have these injuries evaluated and treated by a fellowship trained orthopaedic traumatologist. Consequently, I was consulted to provide further evaluation and management. Patient has been splinted. Denies numbness, tingling, or pain with passive stretch.  History reviewed. No pertinent past medical history.  History reviewed. No pertinent surgical history.  History reviewed. No pertinent family history.  Social History:  reports that he has never smoked. He has never used smokeless tobacco. He reports current alcohol use. No history on file for drug use.  Allergies: No Known Allergies  No medications prior to admission.    Results for orders placed or performed during the hospital encounter of 11/30/20 (from the past 48 hour(s))  CBC with Differential     Status: Abnormal   Collection Time: 11/30/20  9:59 PM  Result Value Ref Range   WBC 13.0 (H) 4.0 - 10.5 K/uL   RBC 4.74 4.22 - 5.81 MIL/uL   Hemoglobin 15.2 13.0 - 17.0 g/dL   HCT 02.5 42.7 - 06.2 %   MCV 90.9 80.0 - 100.0 fL   MCH 32.1 26.0 - 34.0 pg   MCHC 35.3 30.0 - 36.0 g/dL   RDW 37.6 28.3 - 15.1 %   Platelets 234 150 - 400 K/uL   nRBC 0.0 0.0 - 0.2 %   Neutrophils Relative % 80 %   Neutro Abs 10.4 (H) 1.7 - 7.7 K/uL   Lymphocytes Relative 13 %   Lymphs Abs 1.7 0.7 - 4.0 K/uL   Monocytes Relative 5 %   Monocytes Absolute 0.6 0.1 - 1.0 K/uL   Eosinophils Relative  1 %   Eosinophils Absolute 0.1 0.0 - 0.5 K/uL   Basophils Relative 1 %   Basophils Absolute 0.1 0.0 - 0.1 K/uL   Immature Granulocytes 0 %   Abs Immature Granulocytes 0.05 0.00 - 0.07 K/uL    Comment: Performed at [facility], [address] [place], [place] [address]  Comprehensive metabolic panel     Status: Abnormal   Collection Time: 11/30/20  9:59 PM  Result Value Ref Range   Sodium 135 135 - 145 mmol/L   Potassium 4.4 3.5 - 5.1 mmol/L   Chloride 99 98 - 111 mmol/L   CO2 27 22 - 32 mmol/L   Glucose, Bld 105 (H) 70 - 99 mg/dL    Comment: Glucose reference range applies only to samples taken after fasting for at least 8 hours.   BUN 11 6 - 20 mg/dL   Creatinine, Ser 7.37 0.61 - 1.24 mg/dL   Calcium 9.1 8.9 - 10.6 mg/dL   Total Protein 7.8 6.5 - 8.1 g/dL   Albumin 4.5 3.5 - 5.0 g/dL   AST 20 15 - 41 U/L   ALT 19 0 - 44 U/L   Alkaline Phosphatase 45 38 - 126 U/L   Total Bilirubin 1.0 0.3 - 1.2 mg/dL   GFR, Estimated >26 >94 mL/min    Comment: (NOTE) Calculated using the CKD-EPI  Creatinine Equation (2021)    Anion gap 9 5 - 15    Comment: Performed at [facility], [address] [place], [place] [address]  Resp Panel by RT-PCR (Flu A&B, Covid) Nasopharyngeal Swab     Status: None   Collection Time: 11/30/20  9:59 PM   Specimen: Nasopharyngeal Swab; Nasopharyngeal(NP) swabs in vial transport medium  Result Value Ref Range   SARS Coronavirus 2 by RT PCR NEGATIVE NEGATIVE    Comment: (NOTE) SARS-CoV-2 target nucleic acids are NOT DETECTED.  The SARS-CoV-2 RNA is generally detectable in upper respiratory specimens during the acute phase of infection. The lowest concentration of SARS-CoV-2 viral copies this assay can detect is 138 copies/mL. A negative result does not preclude SARS-Cov-2 infection and should not be used as the sole basis for treatment or other patient management decisions. A negative result may occur with  improper specimen  collection/handling, submission of specimen other than nasopharyngeal swab, presence of viral mutation(s) within the areas targeted by this assay, and inadequate number of viral copies(<138 copies/mL). A negative result must be combined with clinical observations, patient history, and epidemiological information. The expected result is Negative.  Fact Sheet for Patients:  BloggerCourse.com  Fact Sheet for Healthcare Providers:  SeriousBroker.it  This test is no t yet approved or cleared by the [place] FDA and  has been authorized for detection and/or diagnosis of SARS-CoV-2 by FDA under an Emergency Use Authorization (EUA). This EUA will remain  in effect (meaning this test can be used) for the duration of the COVID-19 declaration under Section 564(b)(1) of the Act, 21 U.S.C.section 360bbb-3(b)(1), unless the authorization is terminated  or revoked sooner.       Influenza A by PCR NEGATIVE NEGATIVE   Influenza B by PCR NEGATIVE NEGATIVE    Comment: (NOTE) The Xpert Xpress SARS-CoV-2/FLU/RSV plus assay is intended as an aid in the diagnosis of influenza from Nasopharyngeal swab specimens and should not be used as a sole basis for treatment. Nasal washings and aspirates are unacceptable for Xpert Xpress SARS-CoV-2/FLU/RSV testing.  Fact Sheet for Patients: BloggerCourse.com  Fact Sheet for Healthcare Providers: SeriousBroker.it  This test is not yet approved or cleared by the [place] FDA and has been authorized for detection and/or diagnosis of SARS-CoV-2 by FDA under an Emergency Use Authorization (EUA). This EUA will remain in effect (meaning this test can be used) for the duration of the COVID-19 declaration under Section 564(b)(1) of the Act, 21 U.S.C. section 360bbb-3(b)(1), unless the authorization is terminated or revoked.  Performed at [facility], [address] [place], [place] [address]    DG Tibia/Fibula Left  Result Date: 11/30/2020 CLINICAL DATA:  Fall, swelling and deformity of the left ankle EXAM: LEFT ANKLE COMPLETE - 3+ VIEW; LEFT TIBIA AND FIBULA - 2 VIEW COMPARISON:  None. FINDINGS: Ankle: There is an acute comminuted fracture of the distal tibial diaphysis with a proximally 1.2 cm lateral displacement and 0.3 cm anterior displacement of the distal fragment. There is a comminuted fracture of the distal fibular metaphysis with approximately 0.3 cm lateral displacement and 0.5 cm posterior displacement of the distal fragment. There is extension into the syndesmotic joint. There is asymmetry of the ankle mortise suggesting ligamentous injury. There is diffuse soft tissue swelling. Tibia/fibula: There is no acute fracture of the proximal tibia or fibula. Knee alignment is maintained. IMPRESSION: Comminuted and displaced fractures of the distal tibia and fibula as above. Asymmetry of the ankle mortise raises suspicion  for ligamentous injury. Electronically Signed   By: Lesia Hausen M.D.   On: 11/30/2020 20:50   DG Ankle Complete Left  Result Date: 11/30/2020 CLINICAL DATA:  Fall, swelling and deformity of the left ankle EXAM: LEFT ANKLE COMPLETE - 3+ VIEW; LEFT TIBIA AND FIBULA - 2 VIEW COMPARISON:  None. FINDINGS: Ankle: There is an acute comminuted fracture of the distal tibial diaphysis with a proximally 1.2 cm lateral displacement and 0.3 cm anterior displacement of the distal fragment. There is a comminuted fracture of the distal fibular metaphysis with approximately 0.3 cm lateral displacement and 0.5 cm posterior displacement of the distal fragment. There is extension into the syndesmotic joint. There is asymmetry of the ankle mortise suggesting ligamentous injury. There is diffuse soft tissue swelling. Tibia/fibula: There is no acute fracture of the proximal tibia or fibula. Knee alignment is maintained. IMPRESSION: Comminuted  and displaced fractures of the distal tibia and fibula as above. Asymmetry of the ankle mortise raises suspicion for ligamentous injury. Electronically Signed   By: Lesia Hausen M.D.   On: 11/30/2020 20:50   CT Ankle Left Wo Contrast  Result Date: 12/01/2020 CLINICAL DATA:  Left ankle fracture EXAM: CT OF THE LEFT ANKLE WITHOUT CONTRAST TECHNIQUE: Multidetector CT imaging of the left ankle was performed according to the standard protocol. Multiplanar CT image reconstructions were also generated. COMPARISON:  X-ray 11/30/2020 FINDINGS: Bones/Joint/Cartilage Acute obliquely-oriented fracture of the distal tibial diaphysis with approximately 10 mm of lateral displacement. A nondisplaced spiral fracture component extends distally to involve the articular surface of the tibial plafond posteriorly and to the distal tibiofibular joint (series 5, image 111). No significant articular surface diastasis or depression. Comminuted fracture of the distal fibular metadiaphysis. Slight anterolateral displacement of fracture fragments. There are two tiny cortical fragments along the inferior margin of the medial malleolus which appear chronic. Ankle mortise is congruent without widening or dislocation. Osseous structures of the hindfoot and midfoot are intact without fracture or malalignment. No significant arthropathy. Ligaments Suboptimally assessed by CT. Muscles and Tendons No acute musculotendinous abnormality by CT. Soft tissues Soft tissue swelling and ill-defined hematoma most pronounced along the anterior aspect of the lower leg and ankle. IMPRESSION: 1. Acute, mildly displaced fractures of the distal tibia and fibula as described above. 2. Two tiny cortical fragments along the inferior margin of the medial malleolus appear chronic. 3. Soft tissue swelling and ill-defined hematoma most pronounced along the anterior aspect of the lower leg and ankle. Electronically Signed   By: Duanne Guess D.O.   On: 12/01/2020  09:42    ROS No recent fever, bleeding abnormalities, urologic dysfunction, GI problems, or weight gain.   Blood pressure 138/78, pulse 62, temperature 97.8 F (36.6 C), temperature source Oral, resp. rate 17, height 6' (1.829 m), weight 81.6 kg, SpO2 97 %. Physical Exam NCAT, very pleasant, pain moderate LUEx shoulder, elbow, wrist, digits- no skin wounds, nontender, no instability, no blocks to motion  Sens  Ax/R/M/U intact  Mot   Ax/ R/ PIN/ M/ AIN/ U intact  Rad 2+ RUEx shoulder, elbow, wrist, digits- no skin wounds, nontender, no instability, no blocks to motion  Sens  Ax/R/M/U intact  Mot   Ax/ R/ PIN/ M/ AIN/ U intact  Rad 2+ Pelvis--no traumatic wounds or rash, no ecchymosis, stable to manual stress, nontender LLE Splint intact, clean, dry  Edema/ swelling controlled  No pain with passive stretch or active toe motion  Sens: DPN, SPN, TN intact  Motor: EHL, FHL,  and lessor toe ext and flex all intact grossly  Brisk cap refill, warm to touch  Assessment/Plan  Left tibia bimall and comminuted shaft Nonsmoker No current symptoms of compartment syndrome  I discussed with the patient the risks and benefits of surgery, including the possibility of infection, nerve injury, vessel injury, wound breakdown, arthritis, symptomatic hardware, DVT/ PE, loss of motion, malunion, nonunion, and need for further surgery among others.  We also specifically discussed the possible need for multiple implants, the staged plan for sequential fixation, and the elevated risk of soft tissue swelling that could result in compartment syndrome and could lead to amputation.  He acknowledged these risks and wished to proceed.  Myrene Galas, MD [facility], [facility] [phone]  12/01/2020, 10:57 AM  [facility] [address] [place] [address] [phone] (F)

## 2020-12-01 NOTE — Anesthesia Postprocedure Evaluation (Signed)
Anesthesia Post Note  Patient: Markell Sciascia.  Procedure(s) Performed: OPEN REDUCTION INTERNAL FIXATION (ORIF) ANKLE FRACTURE (Left: Ankle) INTRAMEDULLARY (IM) NAIL TIBIAL (Left: Leg Lower)     Patient location during evaluation: PACU Anesthesia Type: General Level of consciousness: awake and alert and oriented Pain management: pain level controlled Vital Signs Assessment: post-procedure vital signs reviewed and stable Respiratory status: spontaneous breathing, nonlabored ventilation and respiratory function stable Cardiovascular status: blood pressure returned to baseline and stable Postop Assessment: no apparent nausea or vomiting Anesthetic complications: no   No notable events documented.  Last Vitals:  Vitals:   12/01/20 1445 12/01/20 1500  BP: 120/72 (!) 163/91  Pulse: 71 93  Resp: 10 20  Temp: 36.7 C   SpO2: 95% 98%    Last Pain:  Vitals:   12/01/20 1511  TempSrc:   PainSc: Asleep                 Arshia Spellman A.

## 2020-12-01 NOTE — ED Notes (Addendum)
EMTALA and all documentation reviewed and up to date at this time. Esign not working pt verbalized understanding for transfer for further services.

## 2020-12-01 NOTE — Progress Notes (Signed)
Orthopedic Tech Progress Note Patient Details:  Kenneth Park Jul 23, 1980 [id number]  PACU RN called requesting a PAIR OF CRUTCHES for patient   Ortho Devices Type of Ortho Device: Crutches Ortho Device/Splint Interventions: Application, Adjustment   Post Interventions Patient Tolerated: Well Instructions Provided: Care of device  [name] 12/01/2020, 5:37 PM

## 2020-12-01 NOTE — ED Notes (Signed)
Pt moved into hospital bed for comfort and brace support. Temporary splint in place due to pt not able to tolerate knee immobilizer. PA made aware. Pain medication ordered due to increase in pain post transition to new bed.

## 2020-12-02 ENCOUNTER — Encounter (HOSPITAL_COMMUNITY): Payer: Self-pay | Admitting: Orthopedic Surgery

## 2020-12-02 ENCOUNTER — Other Ambulatory Visit (HOSPITAL_COMMUNITY): Payer: Self-pay

## 2020-12-02 DIAGNOSIS — S82892A Other fracture of left lower leg, initial encounter for closed fracture: Secondary | ICD-10-CM

## 2020-12-02 DIAGNOSIS — S93432A Sprain of tibiofibular ligament of left ankle, initial encounter: Secondary | ICD-10-CM

## 2020-12-02 HISTORY — DX: Other fracture of left lower leg, initial encounter for closed fracture: S82.892A

## 2020-12-02 HISTORY — DX: Sprain of tibiofibular ligament of left ankle, initial encounter: S93.432A

## 2020-12-02 LAB — CBC
HCT: 37 % — ABNORMAL LOW (ref 39.0–52.0)
Hemoglobin: 12.3 g/dL — ABNORMAL LOW (ref 13.0–17.0)
MCH: 31 pg (ref 26.0–34.0)
MCHC: 33.2 g/dL (ref 30.0–36.0)
MCV: 93.2 fL (ref 80.0–100.0)
Platelets: 211 10*3/uL (ref 150–400)
RBC: 3.97 MIL/uL — ABNORMAL LOW (ref 4.22–5.81)
RDW: 12.8 % (ref 11.5–15.5)
WBC: 11.9 10*3/uL — ABNORMAL HIGH (ref 4.0–10.5)
nRBC: 0 % (ref 0.0–0.2)

## 2020-12-02 LAB — COMPREHENSIVE METABOLIC PANEL
ALT: 13 U/L (ref 0–44)
AST: 18 U/L (ref 15–41)
Albumin: 3.6 g/dL (ref 3.5–5.0)
Alkaline Phosphatase: 40 U/L (ref 38–126)
Anion gap: 6 (ref 5–15)
BUN: 5 mg/dL — ABNORMAL LOW (ref 6–20)
CO2: 26 mmol/L (ref 22–32)
Calcium: 8.9 mg/dL (ref 8.9–10.3)
Chloride: 103 mmol/L (ref 98–111)
Creatinine, Ser: 0.82 mg/dL (ref 0.61–1.24)
GFR, Estimated: >60 mL/min (ref >60)
Glucose, Bld: 116 mg/dL — ABNORMAL HIGH (ref 70–99)
Potassium: 4.2 mmol/L (ref 3.5–5.1)
Sodium: 135 mmol/L (ref 135–145)
Total Bilirubin: 1 mg/dL (ref 0.3–1.2)
Total Protein: 6.1 g/dL — ABNORMAL LOW (ref 6.5–8.1)

## 2020-12-02 LAB — VITAMIN D 25 HYDROXY (VIT D DEFICIENCY, FRACTURES): Vit D, 25-Hydroxy: 40.16 ng/mL (ref 30–100)

## 2020-12-02 MED ORDER — RIVAROXABAN 15 MG PO TABS
15.0000 mg | ORAL_TABLET | Freq: Every day | ORAL | 0 refills | Status: AC
  Filled 2020-12-02: qty 30, 30d supply, fill #0

## 2020-12-02 MED ORDER — ACETAMINOPHEN 500 MG PO TABS
500.0000 mg | ORAL_TABLET | Freq: Two times a day (BID) | ORAL | 0 refills | Status: AC
  Filled 2020-12-02: qty 30, 15d supply, fill #0

## 2020-12-02 MED ORDER — CHOLECALCIFEROL 125 MCG (5000 UT) PO TABS
ORAL_TABLET | Freq: Every day | ORAL | 6 refills | Status: AC
  Filled 2020-12-02: qty 30, 30d supply, fill #0

## 2020-12-02 MED ORDER — ASCORBIC ACID 1000 MG PO TABS
1000.0000 mg | ORAL_TABLET | Freq: Every day | ORAL | 0 refills | Status: AC
  Filled 2020-12-02: qty 30, 30d supply, fill #0

## 2020-12-02 MED ORDER — DOCUSATE SODIUM 100 MG PO CAPS
100.0000 mg | ORAL_CAPSULE | Freq: Two times a day (BID) | ORAL | 0 refills | Status: AC
  Filled 2020-12-02: qty 30, 15d supply, fill #0

## 2020-12-02 MED ORDER — METHOCARBAMOL 500 MG PO TABS
500.0000 mg | ORAL_TABLET | Freq: Four times a day (QID) | ORAL | 0 refills | Status: AC | PRN
  Filled 2020-12-02: qty 60, 8d supply, fill #0

## 2020-12-02 MED ORDER — KETOROLAC TROMETHAMINE 10 MG PO TABS
10.0000 mg | ORAL_TABLET | Freq: Four times a day (QID) | ORAL | 0 refills | Status: AC | PRN
  Filled 2020-12-02: qty 16, 4d supply, fill #0

## 2020-12-02 MED ORDER — OXYCODONE-ACETAMINOPHEN 7.5-325 MG PO TABS
1.0000 | ORAL_TABLET | Freq: Four times a day (QID) | ORAL | 0 refills | Status: AC | PRN
  Filled 2020-12-02: qty 50, 7d supply, fill #0

## 2020-12-02 NOTE — Evaluation (Signed)
Occupational Therapy Evaluation Patient Details Name: Kenneth Park. MRN: [id number] DOB: 14-Oct-1980 Today's Date: 12/02/2020   History of Present Illness 40 y.o. male presents [facility] ED on 11/30/2020 after fall. X-ray of the tibia/fibula and left ankle indicated comminuted distal tibia and fibula fractures. Pt underwent ORIF of L ankle and tibia on 12/01/2020. PMH is insignificant.   Clinical Impression   Mr. Schneider Warchol is a 40 year old man who presents s/p ankle ORIF with NWB status. Patient independent with bed mobility and modified independent with crutches. Overall patient demonstrates the physical abilities to perform ADLs and functional mobility in order to return home. Therapist educated patient on compensatory strategies and recommended use of shower chair if allowed to shower. No further OT needs at this time.       Recommendations for follow up therapy are one component of a multi-disciplinary discharge planning process, led by the attending physician.  Recommendations may be updated based on patient status, additional functional criteria and insurance authorization.   Follow Up Recommendations  No OT follow up    Equipment Recommendations  Tub/shower seat    Recommendations for Other Services       Precautions / Restrictions Precautions Precautions: Fall Restrictions Weight Bearing Restrictions: Yes LLE Weight Bearing: Non weight bearing      Mobility Bed Mobility Overal bed mobility: Independent                  Transfers Overall transfer level: Modified independent Equipment used: Crutches Transfers: Sit to/from Stand Sit to Stand: Supervision         General transfer comment: Seen after PT. Patient able to ambulate with crutches safely.    Balance Overall balance assessment: Needs assistance Sitting-balance support: No upper extremity supported;Feet supported Sitting balance-Leahy Scale: Normal     Standing balance support: Single  extremity supported Standing balance-Leahy Scale: Poor Standing balance comment: Reliant on crutches secondary to NWB status                           ADL either performed or assessed with clinical judgement   ADL Overall ADL's : Modified independent                                             Vision Baseline Vision/History: 0 No visual deficits Patient Visual Report: No change from baseline       Perception     Praxis      Pertinent Vitals/Pain Pain Assessment: Faces Pain Score: 6  Faces Pain Scale: Hurts a little bit Pain Location: L ankle Pain Descriptors / Indicators: Aching Pain Intervention(s): Monitored during session     Hand Dominance     Extremity/Trunk Assessment Upper Extremity Assessment Upper Extremity Assessment: Overall WFL for tasks assessed   Lower Extremity Assessment Lower Extremity Assessment: Defer to PT evaluation LLE Deficits / Details: L hip ROM WFL, strength not formally assessed 2/2 NWB. Pt able to wiggle toes   Cervical / Trunk Assessment Cervical / Trunk Assessment: Normal   Communication Communication Communication: No difficulties   Cognition Arousal/Alertness: Awake/alert Behavior During Therapy: WFL for tasks assessed/performed Overall Cognitive Status: Within Functional Limits for tasks assessed  General Comments  VSS on RA    Exercises     Shoulder Instructions      Home Living Family/patient expects to be discharged to:: Private residence Living Arrangements: Non-relatives/Friends Available Help at Discharge: Friend(s);Available PRN/intermittently (working during the day) Type of Home: House Home Access: Stairs to enter Secretary/administrator of Steps: 3 Entrance Stairs-Rails: None Home Layout: One level     Bathroom Shower/Tub: Chief Strategy Officer: Standard     Home Equipment: None          Prior  Functioning/Environment Level of Independence: Independent        Comments: working in [employer]        OT Problem List: Pain;Decreased knowledge of use of DME or AE      OT Treatment/Interventions:      OT Goals(Current goals can be found in the care plan section) Acute Rehab OT Goals OT Goal Formulation: All assessment and education complete, DC therapy  OT Frequency:     Barriers to D/C:            Co-evaluation              AM-PAC OT "6 Clicks" Daily Activity     Outcome Measure Help from another person eating meals?: None Help from another person taking care of personal grooming?: None Help from another person toileting, which includes using toliet, bedpan, or urinal?: None Help from another person bathing (including washing, rinsing, drying)?: None Help from another person to put on and taking off regular upper body clothing?: None Help from another person to put on and taking off regular lower body clothing?: None 6 Click Score: 24   End of Session Equipment Utilized During Treatment:  (crutches) Nurse Communication:  (No OT needs)  Activity Tolerance: Patient tolerated treatment well Patient left: in bed;with call bell/phone within reach  OT Visit Diagnosis: Pain                Time: 9937-1696 OT Time Calculation (min): 9 min Charges:  OT General Charges $OT Visit: 1 Visit OT Evaluation $OT Eval Low Complexity: 1 Low  Tye Juarez, OTR/L Acute Care Rehab Services  Office [phone] Pager: [phone]   Kelli Churn 12/02/2020, 8:44 AM

## 2020-12-02 NOTE — Evaluation (Signed)
Physical Therapy Evaluation Patient Details Name: Kenneth Park. MRN: [id number] DOB: March 22, 1980 Today's Date: 12/02/2020  History of Present Illness  40 y.o. male presents to [facility] ED on 11/30/2020 after fall. X-ray of the tibia/fibula and left ankle indicated comminuted distal tibia and fibula fractures. Pt underwent ORIF of L ankle and tibia on 12/01/2020. PMH is insignificant.  Clinical Impression  Pt presents to PT with deficits in gait, balance, activity tolerance, power, endurance. Pt is limited by LLE pain, but is able to tolerate household ambulation at this time. Pt benefits from assistance for safety with stair negotiation to maintain stability. Pt demonstrates good stability when ambulating and transferring with use of crutches at this time. Pt will benefit from continued acute PT services to aide in a return to independence. PT recommends no PT services at the time of discharge, crutches have already been delivered to pt's room.       Recommendations for follow up therapy are one component of a multi-disciplinary discharge planning process, led by the attending physician.  Recommendations may be updated based on patient status, additional functional criteria and insurance authorization.  Follow Up Recommendations No PT follow up    Equipment Recommendations  Crutches    Recommendations for Other Services       Precautions / Restrictions Precautions Precautions: Fall Restrictions Weight Bearing Restrictions: Yes LLE Weight Bearing: Non weight bearing      Mobility  Bed Mobility Overal bed mobility: Independent                  Transfers Overall transfer level: Modified independent Equipment used: Crutches Transfers: Sit to/from Stand Sit to Stand: Supervision         General transfer comment: Seen after PT. Patient able to ambulate with crutches safely.  Ambulation/Gait Ambulation/Gait assistance: Supervision Gait Distance (Feet): 150  Feet Assistive device: Crutches Gait Pattern/deviations: Step-through pattern Gait velocity: reduced Gait velocity interpretation: <1.8 ft/sec, indicate of risk for recurrent falls General Gait Details: pt with slowed hop-through gait, reduced stride length  Stairs Stairs: Yes Stairs assistance: Min guard Stair Management: No rails;Step to pattern;Forwards;With crutches Number of Stairs: 3 General stair comments: minG for balance  Wheelchair Mobility    Modified Rankin (Stroke Patients Only)       Balance Overall balance assessment: Needs assistance Sitting-balance support: No upper extremity supported;Feet supported Sitting balance-Leahy Scale: Normal     Standing balance support: Single extremity supported Standing balance-Leahy Scale: Poor Standing balance comment: Reliant on crutches secondary to NWB status                             Pertinent Vitals/Pain Pain Assessment: Faces Pain Score: 6  Faces Pain Scale: Hurts a little bit Pain Location: L ankle Pain Descriptors / Indicators: Aching Pain Intervention(s): Monitored during session    Home Living Family/patient expects to be discharged to:: Private residence Living Arrangements: Non-relatives/Friends Available Help at Discharge: Friend(s);Available PRN/intermittently (working during the day) Type of Home: House Home Access: Stairs to enter Entrance Stairs-Rails: None Secretary/administrator of Steps: 3 Home Layout: One level Home Equipment: None      Prior Function Level of Independence: Independent         Comments: working in [employer]        Extremity/Trunk Assessment   Upper Extremity Assessment Upper Extremity Assessment: Overall WFL for tasks assessed    Lower Extremity Assessment Lower Extremity  Assessment: Defer to PT evaluation LLE Deficits / Details: L hip ROM WFL, strength not formally assessed 2/2 NWB. Pt able to wiggle toes    Cervical / Trunk  Assessment Cervical / Trunk Assessment: Normal  Communication   Communication: No difficulties  Cognition Arousal/Alertness: Awake/alert Behavior During Therapy: WFL for tasks assessed/performed Overall Cognitive Status: Within Functional Limits for tasks assessed                                        General Comments General comments (skin integrity, edema, etc.): VSS on RA    Exercises     Assessment/Plan    PT Assessment Patient needs continued PT services  PT Problem List Decreased activity tolerance;Decreased balance;Decreased mobility;Pain       PT Treatment Interventions DME instruction;Stair training;Gait training;Functional mobility training;Therapeutic activities;Therapeutic exercise;Balance training;Neuromuscular re-education;Patient/family education    PT Goals (Current goals can be found in the Care Plan section)  Acute Rehab PT Goals Patient Stated Goal: To return to independence PT Goal Formulation: With patient Time For Goal Achievement: 12/16/20 Potential to Achieve Goals: Good    Frequency Min 5X/week   Barriers to discharge        Co-evaluation               AM-PAC PT "6 Clicks" Mobility  Outcome Measure Help needed turning from your back to your side while in a flat bed without using bedrails?: None Help needed moving from lying on your back to sitting on the side of a flat bed without using bedrails?: None Help needed moving to and from a bed to a chair (including a wheelchair)?: A Little Help needed standing up from a chair using your arms (e.g., wheelchair or bedside chair)?: A Little Help needed to walk in hospital room?: A Little Help needed climbing 3-5 steps with a railing? : A Little 6 Click Score: 20    End of Session   Activity Tolerance: Patient tolerated treatment well Patient left: in bed;with call bell/phone within reach Nurse Communication: Mobility status PT Visit Diagnosis: Other abnormalities of gait  and mobility (R26.89);Pain Pain - Right/Left: Left Pain - part of body: Ankle and joints of foot;Leg    Time: 0803-0821 PT Time Calculation (min) (ACUTE ONLY): 18 min   Charges:   PT Evaluation $PT Eval Low Complexity: 1 Low          Arlyss Gandy, PT, DPT Acute Rehabilitation Pager: [phone]   Arlyss Gandy 12/02/2020, 8:59 AM

## 2020-12-02 NOTE — Discharge Summary (Signed)
Orthopaedic Trauma Service (OTS) Discharge Summary   Patient ID: Kenneth Park. MRN: [id number] DOB/AGE: [birth year] 40 y.o.  Admit date: 12/01/2020 Discharge date: 12/02/2020  Admission Diagnoses: Closed left tibia fracture Closed lateral malleolus fracture/fibula fracture Left ankle syndesmotic disruption  Discharge Diagnoses:  Principal Problem:   Displaced spiral fracture of shaft of left tibia, initial encounter for closed fracture Active Problems:   Ankle fracture, left   Syndesmotic disruption of ankle, left, initial encounter   Past Medical History:  Diagnosis Date   Ankle fracture, left 12/02/2020   Displaced spiral fracture of shaft of left tibia, initial encounter for closed fracture 12/01/2020   Syndesmotic disruption of ankle, left, initial encounter 12/02/2020     Procedures Performed: 12/01/2020-Dr. Carola Frost INTRAMEDULLARY NAILING WITH BIOMET VERSANAIL 9 X 360 MM STATICALLY LOCKED OPEN REDUCTION INTERNAL FIXATION OF LEFT BIMALLEOLAR ANKLE FRACTURE OPEN REDUCTION INTERNAL FIXATION OF THE SYNDESMOSIS MANUAL APPLICATION OF STRESS ANKLE SYNDESMOSIS UNDER FLUOROSCOPY    Discharged Condition: good  Hospital Course:   40 year old male who sustained a fall off of a stepladder on 11/30/2020.  Patient was brought to [facility] where he was diagnosed with a closed left tibia fracture and left fibula fracture.  Appears that the patient requested transfer to [facility] however no beds were available and they were unable to accept in transfer.  Attempts were made to transfer to [facility] to no avail either.  Ultimately orthopedics was consulted at [facility].  We had availability to admit and take to the operating room.  Patient was transferred to [facility] on 12/01/2020 for the procedures noted above.  Patient tolerated procedures well.  After surgery he was transferred to the PACU for recovery from anesthesia and then transferred to  the observation unit for continued monitoring, pain control and therapies.  Patient's hospital stay was uncomplicated.  He remained on Ancef for perioperative prophylaxis.  He was given a dose of Lovenox postoperatively for DVT and PE prophylaxis and started to work with therapies on postoperative day #1.  Patient was mobilizing well with crutches and walker.  Pain was controlled with oral pain medications.  Patient was voiding without difficulty postoperative day #1 and tolerating a regular diet.  Patient deemed to be stable for discharge on postoperative day #1.  We did attempt to arrange for home health PT but patient is uninsured and unable to obtain the services for him.  Home exercise program was reviewed with the patient with PT/OT in the hospital prior to discharge.  Patient discharged in stable condition on 12/02/2020.  He will be discharged with Percocet, Robaxin, Tylenol and a 4-day course of ketorolac.  In terms of DVT prophylaxis.  He will be discharged on Xarelto 15 mg daily for the next 4 weeks.  This dosage was selected as it is part of the patient assistance program  Patient remain nonweightbearing on his left leg for the next 8 weeks given the syndesmotic injury.  I will check him back in 2 weeks for removal of his splint.  We will then convert him to a cam boot to begin ankle range of motion.  He has unrestricted range of motion of his knee and again we reviewed exercises that he can do to work on knee range of motion.  We would anticipate removal of his syndesmotic screws with or without his fibular hardware around the 74-month mark or so provided that complete union is achieved  At the time of dictation his vitamin D  levels are pending.  Patient will be discharged on vitamin D3 5000 IU daily.  His bone quality did seem reasonable intraoperatively despite his relatively low mechanism of injury  Consults: None  Significant Diagnostic Studies: labs:  Results for Kenneth, Park  (MRN [id number]) as of 12/02/2020 09:40  Ref. Range 12/01/2020 17:08 12/02/2020 04:34  Sodium Latest Ref Range: 135 - 145 mmol/L  135  Potassium Latest Ref Range: 3.5 - 5.1 mmol/L  4.2  Chloride Latest Ref Range: 98 - 111 mmol/L  103  CO2 Latest Ref Range: 22 - 32 mmol/L  26  Glucose Latest Ref Range: 70 - 99 mg/dL  790 (H)  BUN Latest Ref Range: 6 - 20 mg/dL  5 (L)  Creatinine Latest Ref Range: 0.61 - 1.24 mg/dL 2.40 9.73  Calcium Latest Ref Range: 8.9 - 10.3 mg/dL  8.9  Anion gap Latest Ref Range: 5 - 15   6  Alkaline Phosphatase Latest Ref Range: 38 - 126 U/L  40  Albumin Latest Ref Range: 3.5 - 5.0 g/dL  3.6  AST Latest Ref Range: 15 - 41 U/L  18  ALT Latest Ref Range: 0 - 44 U/L  13  Total Protein Latest Ref Range: 6.5 - 8.1 g/dL  6.1 (L)  Total Bilirubin Latest Ref Range: 0.3 - 1.2 mg/dL  1.0  GFR, Estimated Latest Ref Range: >60 mL/min >60 >60  WBC Latest Ref Range: 4.0 - 10.5 K/uL 9.4 11.9 (H)  RBC Latest Ref Range: 4.22 - 5.81 MIL/uL 4.44 3.97 (L)  Hemoglobin Latest Ref Range: 13.0 - 17.0 g/dL 53.2 99.2 (L)  HCT Latest Ref Range: 39.0 - 52.0 % 41.1 37.0 (L)  MCV Latest Ref Range: 80.0 - 100.0 fL 92.6 93.2  MCH Latest Ref Range: 26.0 - 34.0 pg 31.3 31.0  MCHC Latest Ref Range: 30.0 - 36.0 g/dL 42.6 83.4  RDW Latest Ref Range: 11.5 - 15.5 % 13.0 12.8  Platelets Latest Ref Range: 150 - 400 K/uL 214 211  nRBC Latest Ref Range: 0.0 - 0.2 % 0.0 0.0    Treatments: IV hydration, antibiotics: Ancef, analgesia: acetaminophen, IV dilaudid and oxycodone, anticoagulation: LMW heparin and xarelto at dc, therapies: PT, OT, and RN, and surgery: as above  Discharge Exam:  Orthopaedic Trauma Service Progress Note   Patient ID: Kenneth Park. MRN: [id number] DOB/AGE: 1980-09-20 40 y.o.   Subjective:   Doing ok  Pain controlled now Moderate pain overnight  Primary complaint is knee pain  Mobilized this am and did weill    Wants to go home today    ROS As above   Objective:     VITALS:         Vitals:    12/01/20 1953 12/01/20 2322 12/02/20 0516 12/02/20 0804  BP: 112/66 116/69 110/69 115/78  Pulse: 71 63 (!) 57 61  Resp: 18 20 18 17   Temp: 98.2 F (36.8 C) 97.8 F (36.6 C) 97.7 F (36.5 C) (!) 97.5 F (36.4 C)  TempSrc: Oral Oral Oral Oral  SpO2: 98% 100% 98% 100%  Weight:          Height:              Estimated body mass index is 24.41 kg/m as calculated from the following:   Height as of this encounter: 6' (1.829 m).   Weight as of this encounter: 81.6 kg.     Intake/Output      09/27 0701 09/28 0700 09/28 0701 09/29 0700  I.V. (mL/kg) 1000 (12.3)    Total Intake(mL/kg) 1000 (12.3)    Urine (mL/kg/hr) 500    Blood 50    Total Output 550    Net +450            LABS   Lab Results Last 24 Hours       Results for orders placed or performed during the hospital encounter of 12/01/20 (from the past 24 hour(s))  CBC     Status: None    Collection Time: 12/01/20  5:08 PM  Result Value Ref Range    WBC 9.4 4.0 - 10.5 K/uL    RBC 4.44 4.22 - 5.81 MIL/uL    Hemoglobin 13.9 13.0 - 17.0 g/dL    HCT 29.5 62.1 - 30.8 %    MCV 92.6 80.0 - 100.0 fL    MCH 31.3 26.0 - 34.0 pg    MCHC 33.8 30.0 - 36.0 g/dL    RDW 65.7 84.6 - 96.2 %    Platelets 214 150 - 400 K/uL    nRBC 0.0 0.0 - 0.2 %  Creatinine, serum     Status: None    Collection Time: 12/01/20  5:08 PM  Result Value Ref Range    Creatinine, Ser 0.98 0.61 - 1.24 mg/dL    GFR, Estimated >95 >28 mL/min  CBC     Status: Abnormal    Collection Time: 12/02/20  4:34 AM  Result Value Ref Range    WBC 11.9 (H) 4.0 - 10.5 K/uL    RBC 3.97 (L) 4.22 - 5.81 MIL/uL    Hemoglobin 12.3 (L) 13.0 - 17.0 g/dL    HCT 41.3 (L) 24.4 - 52.0 %    MCV 93.2 80.0 - 100.0 fL    MCH 31.0 26.0 - 34.0 pg    MCHC 33.2 30.0 - 36.0 g/dL    RDW 01.0 27.2 - 53.6 %    Platelets 211 150 - 400 K/uL    nRBC 0.0 0.0 - 0.2 %  Comprehensive metabolic panel     Status: Abnormal    Collection Time: 12/02/20  4:34  AM  Result Value Ref Range    Sodium 135 135 - 145 mmol/L    Potassium 4.2 3.5 - 5.1 mmol/L    Chloride 103 98 - 111 mmol/L    CO2 26 22 - 32 mmol/L    Glucose, Bld 116 (H) 70 - 99 mg/dL    BUN 5 (L) 6 - 20 mg/dL    Creatinine, Ser 6.44 0.61 - 1.24 mg/dL    Calcium 8.9 8.9 - 03.4 mg/dL    Total Protein 6.1 (L) 6.5 - 8.1 g/dL    Albumin 3.6 3.5 - 5.0 g/dL    AST 18 15 - 41 U/L    ALT 13 0 - 44 U/L    Alkaline Phosphatase 40 38 - 126 U/L    Total Bilirubin 1.0 0.3 - 1.2 mg/dL    GFR, Estimated >74 >25 mL/min    Anion gap 6 5 - 15          PHYSICAL EXAM:    Gen: resting comfortably in bed, sitting up in bed, on phone initially when I walked in room but he promptly got off  Lungs: unlabored Cardiac: regular  Ext:       Left Lower Extremity              Splint clean, dry and intact  Ext warm              Swelling controlled                   Distal motor and sensory functions intact              + DP pulse             Swelling stable              No pain out of proportion with passive stretching of toes              Good knee ROM, able to get full extension passively    Assessment/Plan: 1 Day Post-Op    Principal Problem:   Displaced spiral fracture of shaft of left tibia, initial encounter for closed fracture Active Problems:   Ankle fracture, left   Syndesmotic disruption of ankle, left, initial encounter     Anti-infectives (From admission, onward)        Start     Dose/Rate Route Frequency Ordered Stop    12/01/20 1745   ceFAZolin (ANCEF) IVPB 1 g/50 mL premix        1 g 100 mL/hr over 30 Minutes Intravenous Every 6 hours 12/01/20 1650 12/02/20 0538    12/01/20 1015   ceFAZolin (ANCEF) IVPB 2g/100 mL premix        2 g 200 mL/hr over 30 Minutes Intravenous On call to O.R. 12/01/20 1008 12/01/20 1130    12/01/20 1010   ceFAZolin (ANCEF) 2-4 GM/100ML-% IVPB       Note to Pharmacy: Shanda Bumps   : cabinet override         12/01/20 1010 12/01/20  1130         .   POD/HD#: 1   40 y/o male s/p fall off step stool with left leg fracture    -fall off step stool   - closed L tibia fracture, L distal fibula fracture and L syndesmotic disruption s/p IMN L tibia, ORIF L fibula, repair of L syndesmosis              NWB L leg             Keep splint intact until follow up              Unrestricted ROM L knee                          No ROM L ankle              Ice and elevate for swelling and pain control                PT/OT   PT- please teach HEP for L knee ROM- AROM, PROM. Prone exercises as well. No ROM restrictions.  Quad sets, SLR, LAQ, SAQ, heel slides, stretching, prone flexion and extension   No pillows under bend of knee when at rest, ok to place under heel to help work on extension. Can also use zero knee bone foam if available     - Pain management:             Multimodal                          Percocet  Robaxin                                    Tylenol    - ABL anemia/Hemodynamics             Stable   - Medical issues              No chronic issues    - DVT/PE prophylaxis:             Dc home with xarelto 15 mg po daily x 4 weeks   - ID:              Periop abx completed    - Metabolic Bone Disease:             Vitamin d levels pending    - Activity:             As tolerated             NWB L leg              Unrestricted ROM L knee    - FEN/GI prophylaxis/Foley/Lines:             Reg diet             Dc lines    -Ex-fix/Splint care:             Keep splint clean and dry    - Dispo:             Dc home today              Follow up with ortho in 2 weeks       Disposition: Discharge disposition: 01-Home or Self Care       Discharge Instructions     Call MD / Call 911   Complete by: As directed    If you experience chest pain or shortness of breath, CALL 911 and be transported to the hospital emergency room.  If you develope a fever above 101 F, pus (white  drainage) or increased drainage or redness at the wound, or calf pain, call your surgeon's office.   Constipation Prevention   Complete by: As directed    Drink plenty of fluids.  Prune juice may be helpful.  You may use a stool softener, such as Colace (over the counter) 100 mg twice a day.  Use MiraLax (over the counter) for constipation as needed.   Diet - low sodium heart healthy   Complete by: As directed    Discharge instructions   Complete by: As directed    Orthopaedic Trauma Service Discharge Instructions   General Discharge Instructions  Orthopaedic Injuries:  Left tibia fracture, left distal fibula fracture (ankle fracture) and left ankle syndesmotic disruption.  Treated with intramedullary nailing of left tibia, open reduction internal fixation left fibula using plate and screws and repair of left ankle syndesmosis  WEIGHT BEARING STATUS: Nonweightbearing left leg for 8 weeks  RANGE OF MOTION/ACTIVITY: No ankle range of motion as you are in a splint.  Unrestricted range of motion of the left knee.  Do not place pillows under the bend of the knee at rest placed them under the ankle/heel to keep knee in full extension.  When in a seated position either keep your knee all the way straight (extended) or let it Park to 90 degrees.  Activity as tolerated while maintaining weightbearing  restrictions  Bone health: Recommend vitamin D3 5000 IUs daily  Wound Care: Do not remove dressings.  Keep splint clean and dry.  We will remove at your first postoperative visit  DVT/PE prophylaxis: Xarelto 15 mg daily x 30 days  Diet: as you were eating previously.  Can use over the counter stool softeners and bowel preparations, such as Miralax, to help with bowel movements.  Narcotics can be constipating.  Be sure to drink plenty of fluids  PAIN MEDICATION USE AND EXPECTATIONS  You have likely been given narcotic medications to help control your pain.  After a traumatic event that results in an  fracture (broken bone) with or without surgery, it is ok to use narcotic pain medications to help control one's pain.  We understand that everyone responds to pain differently and each individual patient will be evaluated on a regular basis for the continued need for narcotic medications. Ideally, narcotic medication use should last no more than 6-8 weeks (coinciding with fracture healing).   As a patient it is your responsibility as well to monitor narcotic medication use and report the amount and frequency you use these medications when you come to your office visit.   We would also advise that if you are using narcotic medications, you should take a dose prior to therapy to maximize you participation.  IF YOU ARE ON NARCOTIC MEDICATIONS IT IS NOT PERMISSIBLE TO OPERATE A MOTOR VEHICLE (MOTORCYCLE/CAR/TRUCK/MOPED) OR HEAVY MACHINERY DO NOT MIX NARCOTICS WITH OTHER CNS (CENTRAL NERVOUS SYSTEM) DEPRESSANTS SUCH AS ALCOHOL   POST-OPERATIVE OPIOID TAPER INSTRUCTIONS:  It is important to wean off of your opioid medication as soon as possible. If you do not need pain medication after your surgery it is ok to stop day one.  Opioids include:  o Codeine, Hydrocodone(Norco, Vicodin), Oxycodone(Percocet, oxycontin) and hydromorphone amongst others.   Long term and even short term use of opiods can cause:  o Increased pain response  o Dependence  o Constipation  o Depression  o Respiratory depression  o And more.   Withdrawal symptoms can include  o Flu like symptoms  o Nausea, vomiting  o And more  Techniques to manage these symptoms  o Hydrate well  o Eat regular healthy meals  o Stay active  o Use relaxation techniques(deep breathing, meditating, yoga)  Do Not substitute Alcohol to help with tapering  If you have been on opioids for less than two weeks and do not have pain than it is ok to stop all together.   Plan to wean off of opioids  o This plan should start within one week post  op of your fracture surgery   o Maintain the same interval or time between taking each dose and first decrease the dose.   o Cut the total daily intake of opioids by one tablet each day  o Next start to increase the time between doses.  o The last dose that should be eliminated is the evening dose.    STOP SMOKING OR USING NICOTINE PRODUCTS!!!!  As discussed nicotine severely impairs your body's ability to heal surgical and traumatic wounds but also impairs bone healing.  Wounds and bone heal by forming microscopic blood vessels (angiogenesis) and nicotine is a vasoconstrictor (essentially, shrinks blood vessels).  Therefore, if vasoconstriction occurs to these microscopic blood vessels they essentially disappear and are unable to deliver necessary nutrients to the healing tissue.  This is one modifiable factor that you can do to dramatically increase your chances  of healing your injury.    (This means no smoking, no nicotine gum, patches, etc)  DO NOT USE NONSTEROIDAL ANTI-INFLAMMATORY DRUGS (NSAID'S)  Using products such as Advil (ibuprofen), Aleve (naproxen), Motrin (ibuprofen) for additional pain control during fracture healing can delay and/or prevent the healing response.  If you would like to take over the counter (OTC) medication, Tylenol (acetaminophen) is ok.  However, some narcotic medications that are given for pain control contain acetaminophen as well. Therefore, you should not exceed more than 4000 mg of tylenol in a day if you do not have liver disease.  Also note that there are may OTC medicines, such as cold medicines and allergy medicines that my contain tylenol as well.  If you have any questions about medications and/or interactions please ask your doctor/PA or your pharmacist.      ICE AND ELEVATE INJURED/OPERATIVE EXTREMITY  Using ice and elevating the injured extremity above your heart can help with swelling and pain control.  Icing in a pulsatile fashion, such as 20 minutes  on and 20 minutes off, can be followed.    Do not place ice directly on skin. Make sure there is a barrier between to skin and the ice pack.    Using frozen items such as frozen peas works well as the conform nicely to the are that needs to be iced.  USE AN ACE WRAP OR TED HOSE FOR SWELLING CONTROL  In addition to icing and elevation, Ace wraps or TED hose are used to help limit and resolve swelling.  It is recommended to use Ace wraps or TED hose until you are informed to stop.    When using Ace Wraps start the wrapping distally (farthest away from the body) and wrap proximally (closer to the body)   Example: If you had surgery on your leg or thing and you do not have a splint on, start the ace wrap at the toes and work your way up to the thigh        If you had surgery on your upper extremity and do not have a splint on, start the ace wrap at your fingers and work your way up to the upper arm  IF YOU ARE IN A SPLINT OR CAST DO NOT REMOVE IT FOR ANY REASON   If your splint gets wet for any reason please contact the office immediately. You may shower in your splint or cast as long as you keep it dry.  This can be done by wrapping in a cast cover or garbage back (or similar)  Do Not stick any thing down your splint or cast such as pencils, money, or hangers to try and scratch yourself with.  If you feel itchy take benadryl as prescribed on the bottle for itching  IF YOU ARE IN A CAM BOOT (BLACK BOOT)  You may remove boot periodically. Perform daily dressing changes as noted below.  Wash the liner of the boot regularly and wear a sock when wearing the boot. It is recommended that you sleep in the boot until told otherwise    Call office for the following: ? Temperature greater than 101F ? Persistent nausea and vomiting ? Severe uncontrolled pain ? Redness, tenderness, or signs of infection (pain, swelling, redness, odor or green/yellow discharge around the site) ? Difficulty breathing, headache  or visual disturbances ? Hives ? Persistent dizziness or light-headedness ? Extreme fatigue ? Any other questions or concerns you may have after discharge  In an emergency,  call 911 or go to an Emergency Department at a nearby hospital  HELPFUL INFORMATION  ? If you had a block, it will wear off between 8-24 hrs postop typically.  This is period when your pain may go from nearly zero to the pain you would have had postop without the block.  This is an abrupt transition but nothing dangerous is happening.  You may take an extra dose of narcotic when this happens.  ? You should wean off your narcotic medicines as soon as you are able.  Most patients will be off or using minimal narcotics before their first postop appointment.   ? We suggest you use the pain medication the first night prior to going to bed, in order to ease any pain when the anesthesia wears off. You should avoid taking pain medications on an empty stomach as it will make you nauseous.  ? Do not drink alcoholic beverages or take illicit drugs when taking pain medications.  ? In most states it is against the law to drive while you are in a splint or sling.  And certainly against the law to drive while taking narcotics.  ? You may return to work/school in the next couple of days when you feel up to it.   ? Pain medication may make you constipated.  Below are a few solutions to try in this order:   ? Decrease the amount of pain medication if you aren't having pain.   ? Drink lots of decaffeinated fluids.   ? Drink prune juice and/or each dried prunes   o If the first 3 don't work start with additional solutions   ? Take Colace - an over-the-counter stool softener   ? Take Senokot - an over-the-counter laxative   ? Take Miralax - a stronger over-the-counter laxative     CALL THE OFFICE WITH ANY QUESTIONS OR CONCERNS: [phone]   VISIT OUR WEBSITE FOR ADDITIONAL INFORMATION: [email or web address]   Increase activity  slowly as tolerated   Complete by: As directed    Post-operative opioid taper instructions:   Complete by: As directed    POST-OPERATIVE OPIOID TAPER INSTRUCTIONS: It is important to wean off of your opioid medication as soon as possible. If you do not need pain medication after your surgery it is ok to stop day one. Opioids include: Codeine, Hydrocodone(Norco, Vicodin), Oxycodone(Percocet, oxycontin) and hydromorphone amongst others.  Long term and even short term use of opiods can cause: Increased pain response Dependence Constipation Depression Respiratory depression And more.  Withdrawal symptoms can include Flu like symptoms Nausea, vomiting And more Techniques to manage these symptoms Hydrate well Eat regular healthy meals Stay active Use relaxation techniques(deep breathing, meditating, yoga) Do Not substitute Alcohol to help with tapering If you have been on opioids for less than two weeks and do not have pain than it is ok to stop all together.  Plan to wean off of opioids This plan should start within one week post op of your joint replacement. Maintain the same interval or time between taking each dose and first decrease the dose.  Cut the total daily intake of opioids by one tablet each day Next start to increase the time between doses. The last dose that should be eliminated is the evening dose.         Allergies as of 12/02/2020   No Known Allergies      Medication List     TAKE these medications    acetaminophen 500 MG tablet Commonly  known as: TYLENOL Take 1 tablet (500 mg total) by mouth every 12 (twelve) hours.   ascorbic acid 1000 MG tablet Commonly known as: VITAMIN C Take 1 tablet (1,000 mg total) by mouth daily. Start taking on: December 03, 2020   docusate sodium 100 MG capsule Commonly known as: COLACE Take 1 capsule (100 mg total) by mouth 2 (two) times daily.   ketorolac 10 MG tablet Commonly known as: TORADOL Take 1 tablet (10  mg total) by mouth every 6 (six) hours as needed for up to 4 days for moderate pain.   methocarbamol 500 MG tablet Commonly known as: ROBAXIN Take 1-2 tablets (500-1,000 mg total) by mouth every 6 (six) hours as needed for muscle spasms.   oxyCODONE-acetaminophen 7.5-325 MG tablet Commonly known as: Percocet Take 1-2 tablets by mouth every 6 (six) hours as needed for severe pain.   Rivaroxaban 15 MG Tabs tablet Commonly known as: XARELTO Take 1 tablet (15 mg total) by mouth daily with supper.   Vitamin D 125 MCG (5000 UT) Caps Take 1 capsule by mouth daily.        Follow-up Information     Myrene Galas, MD. Schedule an appointment as soon as possible for a visit in 2 week(s).   Specialty: Orthopedic Surgery Contact information: [address] [place] [address] [phone]                 Discharge Instructions and Plan:  40 y/o male s/p fall off step stool with left tibia and fibula fracture   - closed L tibia fracture, L distal fibula fracture and L syndesmotic disruption s/p IMN L tibia, ORIF L fibula, repair of L syndesmosis              NWB L leg             Keep splint intact until follow up              Unrestricted ROM L knee                          No ROM L ankle              Ice and elevate for swelling and pain control   Weightbearing: NWB LLE Insicional and dressing care: Dressings left intact until follow-up Orthopedic device(s): Splint, walker/crutches Showering: ok to shower but keep dressing/splint clean and dry  VTE prophylaxis: Xarelto   daily x 4 weeks Pain control: multimodal: percocet, robaxin, tylenol, ketorolac Bone Health/Optimization: vitamin d 3 5000 IUs daily  Follow - up plan: 2 weeks Contact information:  Myrene Galas MD, Montez Morita PA-C   Signed:  Mearl Latin, PA-C [phone] (C) 12/02/2020, 9:34 AM  [facility] [address] [place] [address] [phone] Collier Bullock  (F)

## 2020-12-02 NOTE — Progress Notes (Signed)
CSW contacted by RN regarding rolling walker for uninsured pt.  CSW spoke with pt by phone, he is uninsured but is under workman's comp.  After discussing, pt reports that his preference is to just continuing to use his crutches and not get a walker.  RN informed. Daleen Squibb, MSW, LCSW 9/28/202210:36 AM

## 2020-12-02 NOTE — Progress Notes (Signed)
Patient alert and oriented, mae's well, voiding adequate amount of urine, swallowing without difficulty, no c/o pain at time of discharge. Patient discharged home with family. Medications and discharged instructions given to patient. Patient and family stated understanding of instructions given. Patient informed to schedule a follow-up appointment in 2 weeks.

## 2020-12-02 NOTE — Discharge Instructions (Signed)
[facility] Discharge Instructions   General Discharge Instructions  Orthopaedic Injuries:  Left tibia fracture, left distal fibula fracture (ankle fracture) and left ankle syndesmotic disruption.  Treated with intramedullary nailing of left tibia, open reduction internal fixation left fibula using plate and screws and repair of left ankle syndesmosis  WEIGHT BEARING STATUS: Nonweightbearing left leg for 8 weeks  RANGE OF MOTION/ACTIVITY: No ankle range of motion as you are in a splint.  Unrestricted range of motion of the left knee.  Do not place pillows under the bend of the knee at rest placed them under the ankle/heel to keep knee in full extension.  When in a seated position either keep your knee all the way straight (extended) or let it bent to 90 degrees.  Activity as tolerated while maintaining weightbearing restrictions  Bone health: Recommend vitamin D3 5000 IUs daily  Wound Care: Do not remove dressings.  Keep splint clean and dry.  We will remove at your first postoperative visit  DVT/PE prophylaxis: Xarelto 15 mg daily x 30 days  Diet: as you were eating previously.  Can use over the counter stool softeners and bowel preparations, such as Miralax, to help with bowel movements.  Narcotics can be constipating.  Be sure to drink plenty of fluids  PAIN MEDICATION USE AND EXPECTATIONS  You have likely been given narcotic medications to help control your pain.  After a traumatic event that results in an fracture (broken bone) with or without surgery, it is ok to use narcotic pain medications to help control one's pain.  We understand that everyone responds to pain differently and each individual patient will be evaluated on a regular basis for the continued need for narcotic medications. Ideally, narcotic medication use should last no more than 6-8 weeks (coinciding with fracture healing).   As a patient it is your responsibility as well to monitor narcotic  medication use and report the amount and frequency you use these medications when you come to your office visit.   We would also advise that if you are using narcotic medications, you should take a dose prior to therapy to maximize you participation.  IF YOU ARE ON NARCOTIC MEDICATIONS IT IS NOT PERMISSIBLE TO OPERATE A MOTOR VEHICLE (MOTORCYCLE/CAR/TRUCK/MOPED) OR HEAVY MACHINERY DO NOT MIX NARCOTICS WITH OTHER CNS (CENTRAL NERVOUS SYSTEM) DEPRESSANTS SUCH AS ALCOHOL   POST-OPERATIVE OPIOID TAPER INSTRUCTIONS: It is important to wean off of your opioid medication as soon as possible. If you do not need pain medication after your surgery it is ok to stop day one. Opioids include: Codeine, Hydrocodone(Norco, Vicodin), Oxycodone(Percocet, oxycontin) and hydromorphone amongst others.  Long term and even short term use of opiods can cause: Increased pain response Dependence Constipation Depression Respiratory depression And more.  Withdrawal symptoms can include Flu like symptoms Nausea, vomiting And more Techniques to manage these symptoms Hydrate well Eat regular healthy meals Stay active Use relaxation techniques(deep breathing, meditating, yoga) Do Not substitute Alcohol to help with tapering If you have been on opioids for less than two weeks and do not have pain than it is ok to stop all together.  Plan to wean off of opioids This plan should start within one week post op of your fracture surgery  Maintain the same interval or time between taking each dose and first decrease the dose.  Cut the total daily intake of opioids by one tablet each day Next start to increase the time between doses. The last dose that should be  eliminated is the evening dose.    STOP SMOKING OR USING NICOTINE PRODUCTS!!!!  As discussed nicotine severely impairs your body's ability to heal surgical and traumatic wounds but also impairs bone healing.  Wounds and bone heal by forming microscopic blood  vessels (angiogenesis) and nicotine is a vasoconstrictor (essentially, shrinks blood vessels).  Therefore, if vasoconstriction occurs to these microscopic blood vessels they essentially disappear and are unable to deliver necessary nutrients to the healing tissue.  This is one modifiable factor that you can do to dramatically increase your chances of healing your injury.    (This means no smoking, no nicotine gum, patches, etc)  DO NOT USE NONSTEROIDAL ANTI-INFLAMMATORY DRUGS (NSAID'S)  Using products such as Advil (ibuprofen), Aleve (naproxen), Motrin (ibuprofen) for additional pain control during fracture healing can delay and/or prevent the healing response.  If you would like to take over the counter (OTC) medication, Tylenol (acetaminophen) is ok.  However, some narcotic medications that are given for pain control contain acetaminophen as well. Therefore, you should not exceed more than 4000 mg of tylenol in a day if you do not have liver disease.  Also note that there are may OTC medicines, such as cold medicines and allergy medicines that my contain tylenol as well.  If you have any questions about medications and/or interactions please ask your doctor/PA or your pharmacist.      ICE AND ELEVATE INJURED/OPERATIVE EXTREMITY  Using ice and elevating the injured extremity above your heart can help with swelling and pain control.  Icing in a pulsatile fashion, such as 20 minutes on and 20 minutes off, can be followed.    Do not place ice directly on skin. Make sure there is a barrier between to skin and the ice pack.    Using frozen items such as frozen peas works well as the conform nicely to the are that needs to be iced.  USE AN ACE WRAP OR TED HOSE FOR SWELLING CONTROL  In addition to icing and elevation, Ace wraps or TED hose are used to help limit and resolve swelling.  It is recommended to use Ace wraps or TED hose until you are informed to stop.    When using Ace Wraps start the wrapping  distally (farthest away from the body) and wrap proximally (closer to the body)   Example: If you had surgery on your leg or thing and you do not have a splint on, start the ace wrap at the toes and work your way up to the thigh        If you had surgery on your upper extremity and do not have a splint on, start the ace wrap at your fingers and work your way up to the upper arm  IF YOU ARE IN A SPLINT OR CAST DO NOT REMOVE IT FOR ANY REASON   If your splint gets wet for any reason please contact the office immediately. You may shower in your splint or cast as long as you keep it dry.  This can be done by wrapping in a cast cover or garbage back (or similar)  Do Not stick any thing down your splint or cast such as pencils, money, or hangers to try and scratch yourself with.  If you feel itchy take benadryl as prescribed on the bottle for itching  IF YOU ARE IN A CAM BOOT (BLACK BOOT)  You may remove boot periodically. Perform daily dressing changes as noted below.  Wash the liner of the  boot regularly and wear a sock when wearing the boot. It is recommended that you sleep in the boot until told otherwise    Call office for the following: Temperature greater than 101F Persistent nausea and vomiting Severe uncontrolled pain Redness, tenderness, or signs of infection (pain, swelling, redness, odor or green/yellow discharge around the site) Difficulty breathing, headache or visual disturbances Hives Persistent dizziness or light-headedness Extreme fatigue Any other questions or concerns you may have after discharge  In an emergency, call 911 or go to an Emergency Department at a nearby hospital  HELPFUL INFORMATION  If you had a block, it will wear off between 8-24 hrs postop typically.  This is period when your pain may go from nearly zero to the pain you would have had postop without the block.  This is an abrupt transition but nothing dangerous is happening.  You may take an extra dose of  narcotic when this happens.  You should wean off your narcotic medicines as soon as you are able.  Most patients will be off or using minimal narcotics before their first postop appointment.   We suggest you use the pain medication the first night prior to going to bed, in order to ease any pain when the anesthesia wears off. You should avoid taking pain medications on an empty stomach as it will make you nauseous.  Do not drink alcoholic beverages or take illicit drugs when taking pain medications.  In most states it is against the law to drive while you are in a splint or sling.  And certainly against the law to drive while taking narcotics.  You may return to work/school in the next couple of days when you feel up to it.   Pain medication may make you constipated.  Below are a few solutions to try in this order: Decrease the amount of pain medication if you aren't having pain. Drink lots of decaffeinated fluids. Drink prune juice and/or each dried prunes  If the first 3 don't work start with additional solutions Take Colace - an over-the-counter stool softener Take Senokot - an over-the-counter laxative Take Miralax - a stronger over-the-counter laxative     CALL THE OFFICE WITH ANY QUESTIONS OR CONCERNS: [phone]   VISIT OUR WEBSITE FOR ADDITIONAL INFORMATION: [email or web address]

## 2020-12-02 NOTE — Progress Notes (Signed)
[facility]thopaedic Trauma Service Progress Note  Patient ID: Kenneth Park. MRN: [id number] DOB/AGE: 40-Sep-1982 40 y.o.  Subjective:  Doing ok  Pain controlled now Moderate pain overnight  Primary complaint is knee pain  Mobilized this am and did weill   Wants to go home today   ROS As above  Objective:   VITALS:   Vitals:   12/01/20 1953 12/01/20 2322 12/02/20 0516 12/02/20 0804  BP: 112/66 116/69 110/69 115/78  Pulse: 71 63 (!) 57 61  Resp: 18 20 18 17   Temp: 98.2 F (36.8 C) 97.8 F (36.6 C) 97.7 F (36.5 C) (!) 97.5 F (36.4 C)  TempSrc: Oral Oral Oral Oral  SpO2: 98% 100% 98% 100%  Weight:      Height:        Estimated body mass index is 24.41 kg/m as calculated from the following:   Height as of this encounter: 6' (1.829 m).   Weight as of this encounter: 81.6 kg.   Intake/Output      09/27 0701 09/28 0700 09/28 0701 09/29 0700   I.V. (mL/kg) 1000 (12.3)    Total Intake(mL/kg) 1000 (12.3)    Urine (mL/kg/hr) 500    Blood 50    Total Output 550    Net +450           LABS  Results for orders placed or performed during the hospital encounter of 12/01/20 (from the past 24 hour(s))  CBC     Status: None   Collection Time: 12/01/20  5:08 PM  Result Value Ref Range   WBC 9.4 4.0 - 10.5 K/uL   RBC 4.44 4.22 - 5.81 MIL/uL   Hemoglobin 13.9 13.0 - 17.0 g/dL   HCT 12/03/20 77.4 - 12.8 %   MCV 92.6 80.0 - 100.0 fL   MCH 31.3 26.0 - 34.0 pg   MCHC 33.8 30.0 - 36.0 g/dL   RDW 78.6 76.7 - 20.9 %   Platelets 214 150 - 400 K/uL   nRBC 0.0 0.0 - 0.2 %  Creatinine, serum     Status: None   Collection Time: 12/01/20  5:08 PM  Result Value Ref Range   Creatinine, Ser 0.98 0.61 - 1.24 mg/dL   GFR, Estimated 12/03/20 >96 mL/min  CBC     Status: Abnormal   Collection Time: 12/02/20  4:34 AM  Result Value Ref Range   WBC 11.9 (H) 4.0 - 10.5 K/uL   RBC 3.97 (L) 4.22 - 5.81 MIL/uL    Hemoglobin 12.3 (L) 13.0 - 17.0 g/dL   HCT 12/04/20 (L) 36.6 - 29.4 %   MCV 93.2 80.0 - 100.0 fL   MCH 31.0 26.0 - 34.0 pg   MCHC 33.2 30.0 - 36.0 g/dL   RDW 76.5 46.5 - 03.5 %   Platelets 211 150 - 400 K/uL   nRBC 0.0 0.0 - 0.2 %  Comprehensive metabolic panel     Status: Abnormal   Collection Time: 12/02/20  4:34 AM  Result Value Ref Range   Sodium 135 135 - 145 mmol/L   Potassium 4.2 3.5 - 5.1 mmol/L   Chloride 103 98 - 111 mmol/L   CO2 26 22 - 32 mmol/L   Glucose, Bld 116 (H) 70 - 99 mg/dL   BUN 5 (L) 6 - 20 mg/dL  Creatinine, Ser 0.82 0.61 - 1.24 mg/dL   Calcium 8.9 8.9 - 63.0 mg/dL   Total Protein 6.1 (L) 6.5 - 8.1 g/dL   Albumin 3.6 3.5 - 5.0 g/dL   AST 18 15 - 41 U/L   ALT 13 0 - 44 U/L   Alkaline Phosphatase 40 38 - 126 U/L   Total Bilirubin 1.0 0.3 - 1.2 mg/dL   GFR, Estimated >16 >01 mL/min   Anion gap 6 5 - 15     PHYSICAL EXAM:   Gen: resting comfortably in bed, sitting up in bed, on phone initially when I walked in room but he promptly got off  Lungs: unlabored Cardiac: regular  Ext:       Left Lower Extremity   Splint clean, dry and intact  Ext warm   Swelling controlled    Distal motor and sensory functions intact   + DP pulse  Swelling stable   No pain out of proportion with passive stretching of toes   Good knee ROM, able to get full extension passively   Assessment/Plan: 1 Day Post-Op   Principal Problem:   Displaced spiral fracture of shaft of left tibia, initial encounter for closed fracture Active Problems:   Ankle fracture, left   Syndesmotic disruption of ankle, left, initial encounter   Anti-infectives (From admission, onward)    Start     Dose/Rate Route Frequency Ordered Stop   12/01/20 1745  ceFAZolin (ANCEF) IVPB 1 g/50 mL premix        1 g 100 mL/hr over 30 Minutes Intravenous Every 6 hours 12/01/20 1650 12/02/20 0538   12/01/20 1015  ceFAZolin (ANCEF) IVPB 2g/100 mL premix        2 g 200 mL/hr over 30 Minutes Intravenous On  call to O.R. 12/01/20 1008 12/01/20 1130   12/01/20 1010  ceFAZolin (ANCEF) 2-4 GM/100ML-% IVPB       Note to Pharmacy: Shanda Bumps   : cabinet override      12/01/20 1010 12/01/20 1130     .  POD/HD#: 1  40 y/o male s/p fall off step stool with left leg fracture   -fall off step stool  - closed L tibia fracture, L distal fibula fracture and L syndesmotic disruption s/p IMN L tibia, ORIF L fibula, repair of L syndesmosis   NWB L leg  Keep splint intact until follow up   Unrestricted ROM L knee    No ROM L ankle   Ice and elevate for swelling and pain control    PT/OT  PT- please teach HEP for L knee ROM- AROM, PROM. Prone exercises as well. No ROM restrictions.  Quad sets, SLR, LAQ, SAQ, heel slides, stretching, prone flexion and extension  No pillows under bend of knee when at rest, ok to place under heel to help work on extension. Can also use zero knee bone foam if available   - Pain management:  Multimodal    Percocet    Robaxin    Tylenol   - ABL anemia/Hemodynamics  Stable  - Medical issues   No chronic issues   - DVT/PE prophylaxis:  Dc home with xarelto 15 mg po daily x 4 weeks  - ID:   Periop abx completed   - Metabolic Bone Disease:  Vitamin d levels pending   - Activity:  As tolerated  NWB L leg   Unrestricted ROM L knee   - FEN/GI prophylaxis/Foley/Lines:  Reg diet  Dc lines   -Ex-fix/Splint care:  Keep splint clean and dry   - Dispo:  Dc home today   Follow up with ortho in 2 weeks     Mearl Latin, PA-C [phone] (C) 12/02/2020, 9:17 AM  [facility] [address] [place] [address] [phone] [phone] (F)    After 5pm and on the weekends please log on to Amion, go to orthopaedics and the look under the [facility] Call for the provider(s) on call. You can also call our office at [phone] and then follow the prompts to be connected to the call team.

## 2020-12-03 ENCOUNTER — Encounter (HOSPITAL_COMMUNITY): Payer: Self-pay | Admitting: Orthopedic Surgery

## 2020-12-17 ENCOUNTER — Other Ambulatory Visit (HOSPITAL_COMMUNITY): Payer: Self-pay

## 2020-12-17 ENCOUNTER — Telehealth (HOSPITAL_COMMUNITY): Payer: Self-pay

## 2020-12-17 NOTE — Telephone Encounter (Signed)
Pharmacy Transitions of Care Follow-up Telephone Call  Date of discharge: 12/02/20  Discharge Diagnosis: closed fracture  How have you been since you were released from the hospital?  Patient has been okay since discharge. Has had swelling in toes but MD is aware and stated swelling is normal for this kind of injury. Patient cast has been removed and now in a boot. No questions about meds at this time.  Medication changes made at discharge:      START taking: Acetaminophen Extra Strength (acetaminophen)  docusate sodium (COLACE)  methocarbamol (ROBAXIN)  Natural Vitamin D-3 (Cholecalciferol)  oxyCODONE-acetaminophen (Percocet)  vitamin C  Xarelto (Rivaroxaban)   Medication changes verified by the patient? Yes    Medication Accessibility:  Home Pharmacy: not discussed   Was the patient provided with refills on discharged medications? No   Have all prescriptions been transferred from [facility] to home pharmacy? N/A   Is the patient able to afford medications? 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  - Discussed importance of taking medication with food and around the same time everyday  - Advised patient of medications to avoid (NSAIDs, ASA)  - Educated that Tylenol (acetaminophen) will be the preferred analgesic to prevent risk of bleeding  - Emphasized importance of monitoring for signs and symptoms of bleeding (abnormal bruising, prolonged bleeding, nose bleeds, bleeding from gums, discolored urine, black tarry stools)  - Advised patient to alert all providers of anticoagulation therapy prior to starting a new medication or having a procedure   Follow-up Appointments:  PCP Hospital f/u appt confirmed? None currently scheduled  Specialist Hospital f/u appt confirmed? Patient has already had follow up with ortho  If their condition  worsens, is the pt aware to call PCP or go to the Emergency Dept.? Yes  Final Patient Assessment: Patient has follow up scheduled and knows to get refills at f/u if needed

## 2021-06-21 ENCOUNTER — Other Ambulatory Visit (HOSPITAL_COMMUNITY): Payer: Self-pay

## 2021-09-15 DIAGNOSIS — K219 Gastro-esophageal reflux disease without esophagitis: Secondary | ICD-10-CM | POA: Diagnosis not present

## 2021-09-15 DIAGNOSIS — Z131 Encounter for screening for diabetes mellitus: Secondary | ICD-10-CM | POA: Diagnosis not present

## 2021-09-15 DIAGNOSIS — Z1389 Encounter for screening for other disorder: Secondary | ICD-10-CM | POA: Diagnosis not present

## 2021-09-15 DIAGNOSIS — Z125 Encounter for screening for malignant neoplasm of prostate: Secondary | ICD-10-CM | POA: Diagnosis not present

## 2021-09-15 DIAGNOSIS — M79605 Pain in left leg: Secondary | ICD-10-CM | POA: Diagnosis not present

## 2022-10-10 IMAGING — CT CT ANKLE*L* W/O CM
1 series · 12 of 14 positions shown, 15 images · non-contrast
Comparison: X-ray 11/30/2020

CLINICAL DATA: Left ankle fracture

EXAM:
CT OF THE LEFT ANKLE WITHOUT CONTRAST
TECHNIQUE: Multidetector CT imaging of the left ankle was performed according
to the standard protocol. Multiplanar CT image reconstructions were
also generated.

[Series 6: axial st · axial · 0.39mm/px · z∈[+200,+448]mm · 12 of 195 slices shown, 15 images]
[im 15/195  soft-tissue]
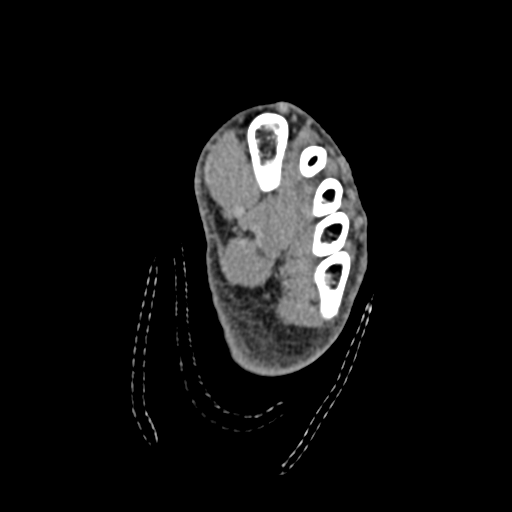
[im 15/195  bone]
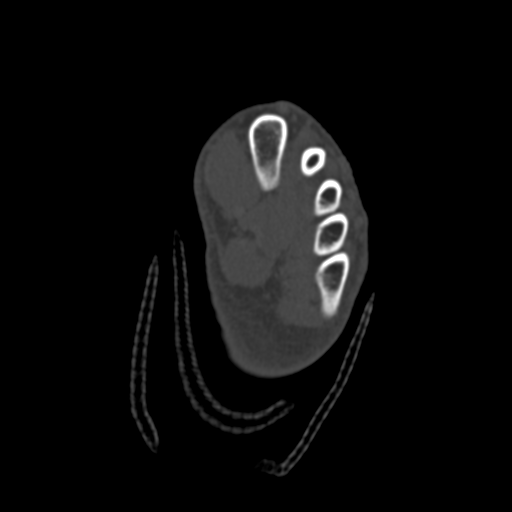
[im 30/195  bone]
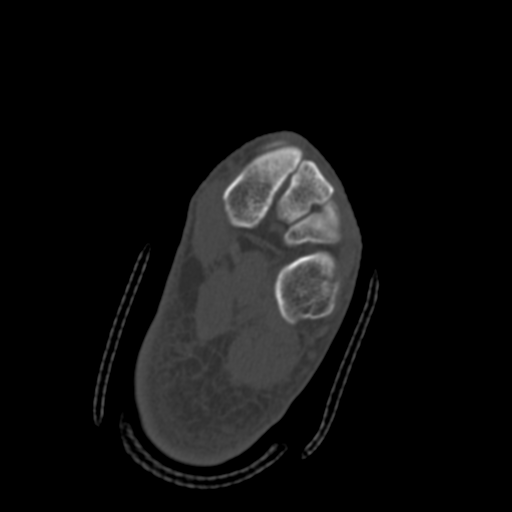
[im 45/195  bone]
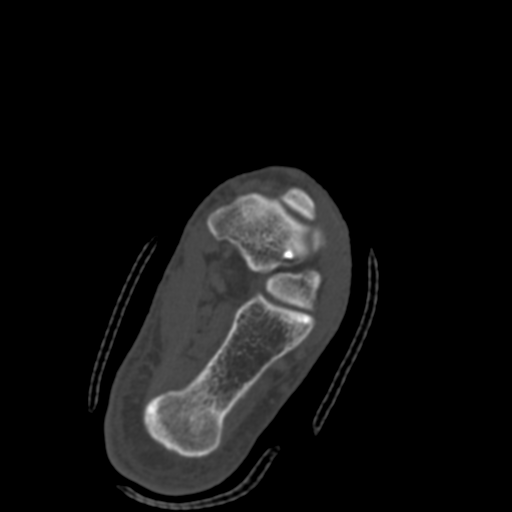
[im 60/195  bone]
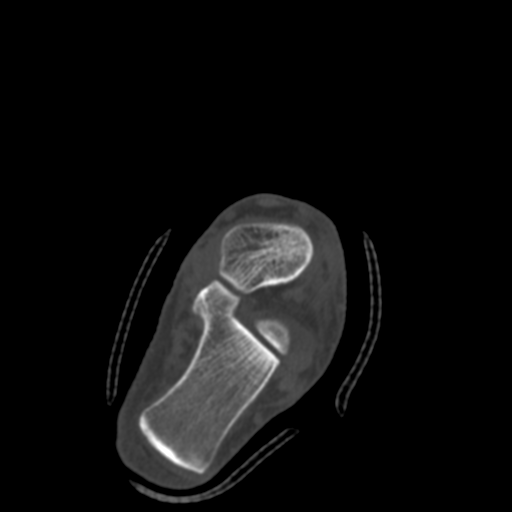
[im 75/195  soft-tissue]
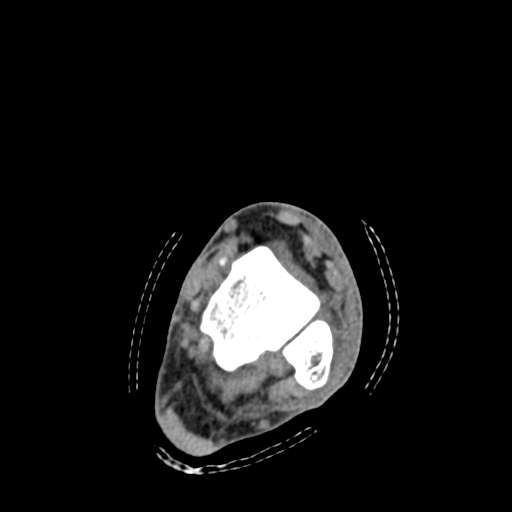
[im 75/195  bone]
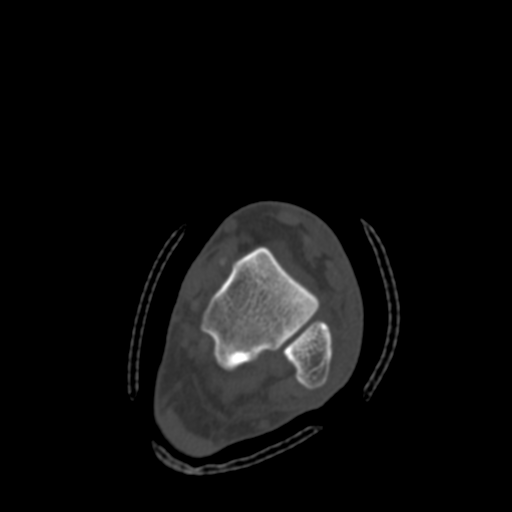
[im 90/195  bone]
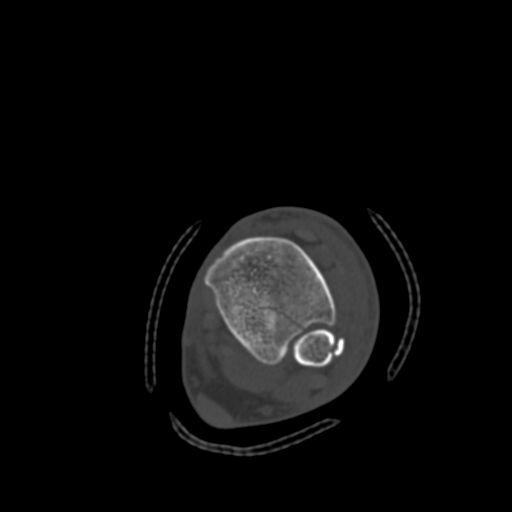
[im 105/195  bone]
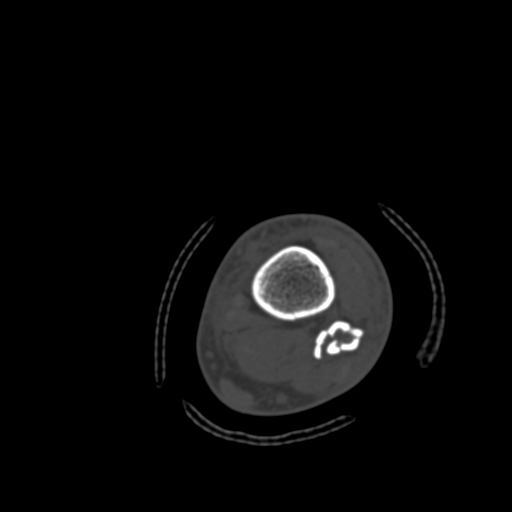
[im 120/195  bone]
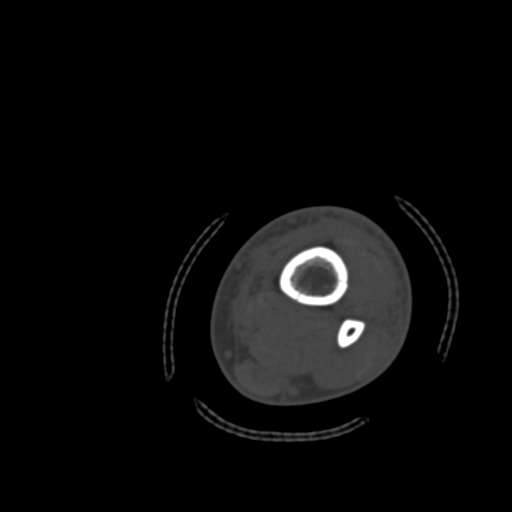
[im 135/195  soft-tissue]
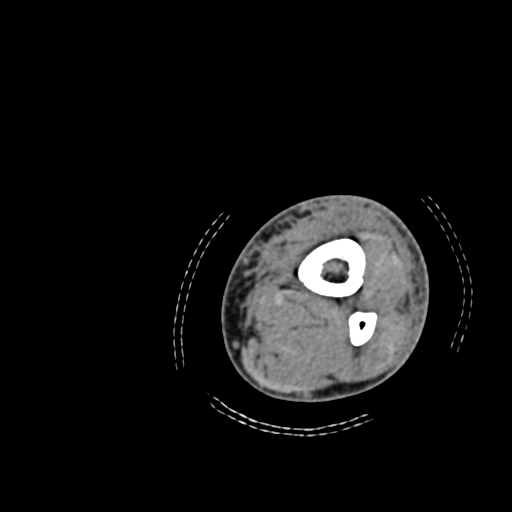
[im 135/195  bone]
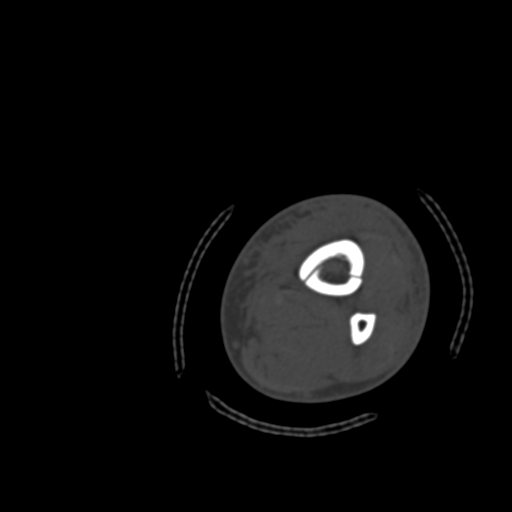
[im 150/195  bone]
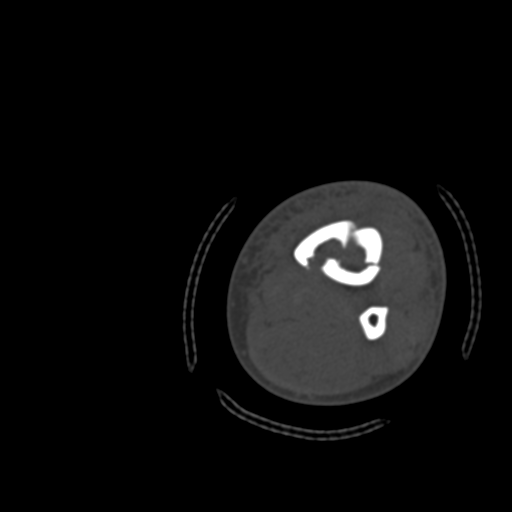
[im 165/195  bone]
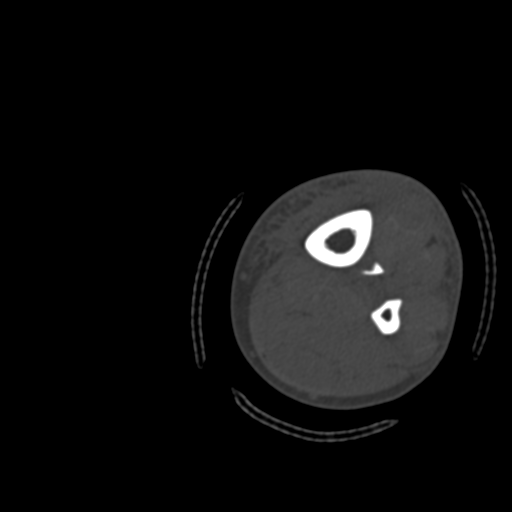
[im 180/195  bone]
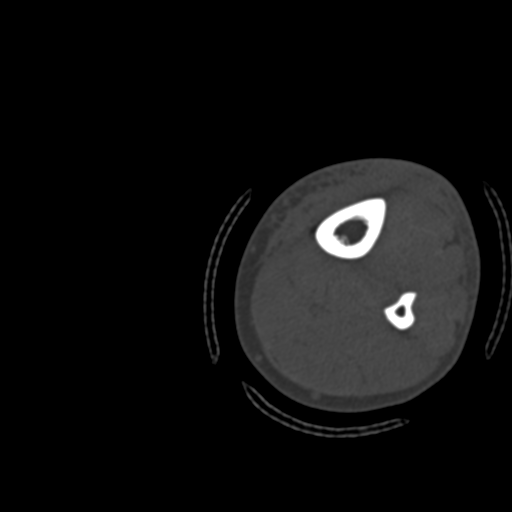

[12 of 14 positions shown; findings below may reference images not displayed]

FINDINGS: Bones/Joint/Cartilage

Acute obliquely-oriented fracture of the distal tibial diaphysis
with approximately 10 mm of lateral displacement. A nondisplaced
spiral fracture component extends distally to involve the articular
surface of the tibial plafond posteriorly and to the distal
tibiofibular joint (series 5, image 111). No significant articular
surface diastasis or depression.

Comminuted fracture of the distal fibular metadiaphysis. Slight
anterolateral displacement of fracture fragments.

There are two tiny cortical fragments along the inferior margin of
the medial malleolus which appear chronic.

Ankle mortise is congruent without widening or dislocation. Osseous
structures of the hindfoot and midfoot are intact without fracture
or malalignment. No significant arthropathy.

Ligaments

Suboptimally assessed by CT.

Muscles and Tendons

No acute musculotendinous abnormality by CT.

Soft tissues

Soft tissue swelling and ill-defined hematoma most pronounced along
the anterior aspect of the lower leg and ankle.
IMPRESSION: 1. Acute, mildly displaced fractures of the distal tibia and fibula
as described above.
2. Two tiny cortical fragments along the inferior margin of the
medial malleolus appear chronic.
3. Soft tissue swelling and ill-defined hematoma most pronounced
along the anterior aspect of the lower leg and ankle.
